# Patient Record
Sex: Female | Born: 1990 | Race: Black or African American | Hispanic: No | Marital: Single | Smoking: Never smoker
Health system: Southern US, Community
[De-identification: ages and names within clinical notes are randomized; demographics above are authoritative.]

## PROBLEM LIST (undated history)

## (undated) DIAGNOSIS — J45909 Unspecified asthma, uncomplicated: Secondary | ICD-10-CM

## (undated) DIAGNOSIS — O24419 Gestational diabetes mellitus in pregnancy, unspecified control: Secondary | ICD-10-CM

## (undated) DIAGNOSIS — J189 Pneumonia, unspecified organism: Secondary | ICD-10-CM

## (undated) HISTORY — DX: Pneumonia, unspecified organism: J18.9

## (undated) HISTORY — DX: Unspecified asthma, uncomplicated: J45.909

## (undated) HISTORY — PX: NO PAST SURGERIES: SHX2092

---
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
## 2009-10-09 ENCOUNTER — Emergency Department (HOSPITAL_COMMUNITY): Admission: EM | Admit: 2009-10-09 | Discharge: 2009-10-09 | Payer: Self-pay | Admitting: Emergency Medicine

## 2010-03-13 ENCOUNTER — Emergency Department (HOSPITAL_COMMUNITY)
Admission: EM | Admit: 2010-03-13 | Discharge: 2010-03-13 | Disposition: A | Discharge status: Home / Self Care | Payer: Self-pay | Attending: Emergency Medicine | Admitting: Emergency Medicine

## 2010-03-13 DIAGNOSIS — M25519 Pain in unspecified shoulder: Secondary | ICD-10-CM | POA: Insufficient documentation

## 2010-03-13 DIAGNOSIS — R209 Unspecified disturbances of skin sensation: Secondary | ICD-10-CM | POA: Insufficient documentation

## 2010-03-13 DIAGNOSIS — R21 Rash and other nonspecific skin eruption: Secondary | ICD-10-CM | POA: Insufficient documentation

## 2010-03-13 DIAGNOSIS — R29898 Other symptoms and signs involving the musculoskeletal system: Secondary | ICD-10-CM | POA: Insufficient documentation

## 2010-03-13 DIAGNOSIS — J45909 Unspecified asthma, uncomplicated: Secondary | ICD-10-CM | POA: Insufficient documentation

## 2010-03-13 DIAGNOSIS — R6883 Chills (without fever): Secondary | ICD-10-CM | POA: Insufficient documentation

## 2010-03-13 DIAGNOSIS — R51 Headache: Secondary | ICD-10-CM | POA: Insufficient documentation

## 2010-03-13 DIAGNOSIS — M79609 Pain in unspecified limb: Secondary | ICD-10-CM | POA: Insufficient documentation

## 2010-03-13 LAB — URINALYSIS, ROUTINE W REFLEX MICROSCOPIC
Bilirubin Urine: NEGATIVE
Glucose, UA: NEGATIVE mg/dL
Ketones, ur: NEGATIVE mg/dL
Nitrite: NEGATIVE
Protein, ur: NEGATIVE mg/dL
Specific Gravity, Urine: 1.012 (ref 1.005–1.030)
Urobilinogen, UA: 0.2 mg/dL (ref 0.0–1.0)
pH: 5.5 (ref 5.0–8.0)

## 2010-03-13 LAB — URINE MICROSCOPIC-ADD ON

## 2010-03-13 LAB — POCT I-STAT, CHEM 8
BUN: 8 mg/dL (ref 6–23)
Chloride: 102 mEq/L (ref 96–112)
Creatinine, Ser: 0.9 mg/dL (ref 0.4–1.2)
Sodium: 138 mEq/L (ref 135–145)
TCO2: 26 mmol/L (ref 0–100)

## 2010-03-13 LAB — POCT PREGNANCY, URINE: Preg Test, Ur: NEGATIVE

## 2010-05-03 ENCOUNTER — Inpatient Hospital Stay (INDEPENDENT_AMBULATORY_CARE_PROVIDER_SITE_OTHER)
Admission: RE | Admit: 2010-05-03 | Discharge: 2010-05-03 | Disposition: A | Discharge status: Home / Self Care | Payer: Self-pay | Source: Ambulatory Visit | Attending: Family Medicine | Admitting: Family Medicine

## 2010-05-03 DIAGNOSIS — N390 Urinary tract infection, site not specified: Secondary | ICD-10-CM

## 2010-05-03 DIAGNOSIS — N898 Other specified noninflammatory disorders of vagina: Secondary | ICD-10-CM

## 2010-05-03 LAB — POCT URINALYSIS DIP (DEVICE)
Bilirubin Urine: NEGATIVE
Glucose, UA: NEGATIVE mg/dL
Ketones, ur: NEGATIVE mg/dL
Specific Gravity, Urine: 1.015 (ref 1.005–1.030)
pH: 7 (ref 5.0–8.0)

## 2010-05-11 ENCOUNTER — Emergency Department (HOSPITAL_COMMUNITY)
Admission: EM | Admit: 2010-05-11 | Discharge: 2010-05-11 | Disposition: A | Discharge status: Home / Self Care | Payer: Self-pay | Attending: Emergency Medicine | Admitting: Emergency Medicine

## 2010-05-11 DIAGNOSIS — L298 Other pruritus: Secondary | ICD-10-CM | POA: Insufficient documentation

## 2010-05-11 DIAGNOSIS — J3489 Other specified disorders of nose and nasal sinuses: Secondary | ICD-10-CM | POA: Insufficient documentation

## 2010-05-11 DIAGNOSIS — L2989 Other pruritus: Secondary | ICD-10-CM | POA: Insufficient documentation

## 2010-05-11 DIAGNOSIS — R070 Pain in throat: Secondary | ICD-10-CM | POA: Insufficient documentation

## 2010-05-11 DIAGNOSIS — R071 Chest pain on breathing: Secondary | ICD-10-CM | POA: Insufficient documentation

## 2010-05-11 DIAGNOSIS — J45909 Unspecified asthma, uncomplicated: Secondary | ICD-10-CM | POA: Insufficient documentation

## 2010-05-11 DIAGNOSIS — L259 Unspecified contact dermatitis, unspecified cause: Secondary | ICD-10-CM | POA: Insufficient documentation

## 2010-05-11 DIAGNOSIS — J069 Acute upper respiratory infection, unspecified: Secondary | ICD-10-CM | POA: Insufficient documentation

## 2010-06-21 ENCOUNTER — Emergency Department (HOSPITAL_COMMUNITY)
Admission: EM | Admit: 2010-06-21 | Discharge: 2010-06-21 | Disposition: A | Discharge status: Home / Self Care | Payer: Self-pay | Attending: Emergency Medicine | Admitting: Emergency Medicine

## 2010-06-21 DIAGNOSIS — J3489 Other specified disorders of nose and nasal sinuses: Secondary | ICD-10-CM | POA: Insufficient documentation

## 2010-06-21 DIAGNOSIS — R059 Cough, unspecified: Secondary | ICD-10-CM | POA: Insufficient documentation

## 2010-06-21 DIAGNOSIS — R6883 Chills (without fever): Secondary | ICD-10-CM | POA: Insufficient documentation

## 2010-06-21 DIAGNOSIS — J069 Acute upper respiratory infection, unspecified: Secondary | ICD-10-CM | POA: Insufficient documentation

## 2010-06-21 DIAGNOSIS — R110 Nausea: Secondary | ICD-10-CM | POA: Insufficient documentation

## 2010-06-21 DIAGNOSIS — J45909 Unspecified asthma, uncomplicated: Secondary | ICD-10-CM | POA: Insufficient documentation

## 2010-06-21 DIAGNOSIS — J029 Acute pharyngitis, unspecified: Secondary | ICD-10-CM | POA: Insufficient documentation

## 2010-06-21 DIAGNOSIS — R05 Cough: Secondary | ICD-10-CM | POA: Insufficient documentation

## 2010-06-21 DIAGNOSIS — B9789 Other viral agents as the cause of diseases classified elsewhere: Secondary | ICD-10-CM | POA: Insufficient documentation

## 2010-06-21 DIAGNOSIS — R197 Diarrhea, unspecified: Secondary | ICD-10-CM | POA: Insufficient documentation

## 2010-10-15 LAB — POCT RAPID STREP A: Streptococcus, Group A Screen (Direct): NEGATIVE

## 2013-09-27 ENCOUNTER — Encounter (HOSPITAL_COMMUNITY): Payer: Self-pay | Admitting: Emergency Medicine

## 2013-09-27 ENCOUNTER — Emergency Department (HOSPITAL_COMMUNITY)
Admission: EM | Admit: 2013-09-27 | Discharge: 2013-09-27 | Disposition: A | Discharge status: Home / Self Care | Payer: Self-pay | Attending: Emergency Medicine | Admitting: Emergency Medicine

## 2013-09-27 DIAGNOSIS — N390 Urinary tract infection, site not specified: Secondary | ICD-10-CM | POA: Insufficient documentation

## 2013-09-27 DIAGNOSIS — Z3202 Encounter for pregnancy test, result negative: Secondary | ICD-10-CM | POA: Insufficient documentation

## 2013-09-27 DIAGNOSIS — A499 Bacterial infection, unspecified: Secondary | ICD-10-CM | POA: Insufficient documentation

## 2013-09-27 DIAGNOSIS — N760 Acute vaginitis: Secondary | ICD-10-CM | POA: Insufficient documentation

## 2013-09-27 DIAGNOSIS — R109 Unspecified abdominal pain: Secondary | ICD-10-CM | POA: Insufficient documentation

## 2013-09-27 DIAGNOSIS — B9689 Other specified bacterial agents as the cause of diseases classified elsewhere: Secondary | ICD-10-CM | POA: Insufficient documentation

## 2013-09-27 LAB — URINE MICROSCOPIC-ADD ON

## 2013-09-27 LAB — URINALYSIS, ROUTINE W REFLEX MICROSCOPIC
BILIRUBIN URINE: NEGATIVE
Glucose, UA: NEGATIVE mg/dL
Ketones, ur: NEGATIVE mg/dL
Nitrite: NEGATIVE
PROTEIN: NEGATIVE mg/dL
Specific Gravity, Urine: 1.011 (ref 1.005–1.030)
UROBILINOGEN UA: 0.2 mg/dL (ref 0.0–1.0)
pH: 6 (ref 5.0–8.0)

## 2013-09-27 LAB — POC URINE PREG, ED: PREG TEST UR: NEGATIVE

## 2013-09-27 LAB — WET PREP, GENITAL
Trich, Wet Prep: NONE SEEN
YEAST WET PREP: NONE SEEN

## 2013-09-27 LAB — RPR

## 2013-09-27 LAB — HIV ANTIBODY (ROUTINE TESTING W REFLEX): HIV 1&2 Ab, 4th Generation: NONREACTIVE

## 2013-09-27 MED ORDER — NAPROXEN 500 MG PO TABS: 500.0000 mg | ORAL_TABLET | Freq: Two times a day (BID) | ORAL | Status: DC

## 2013-09-27 MED ORDER — CEPHALEXIN 500 MG PO CAPS: 500.0000 mg | ORAL_CAPSULE | Freq: Four times a day (QID) | ORAL | Status: DC

## 2013-09-27 MED ORDER — METRONIDAZOLE 500 MG PO TABS: 500.0000 mg | ORAL_TABLET | Freq: Two times a day (BID) | ORAL | Status: DC

## 2013-09-27 NOTE — Discharge Instructions (Signed)
You have been seen today for your complaint lower abdominal pain. Your wet prep shows Bacterial vaginosis (read below). Your lab work showed urine infection. Your discharge medications include: 1) Keflex Please take all of your antibiotics until finished!   You may develop abdominal discomfort or diarrhea from the antibiotic.  You may help offset this with probiotics which you can buy or get in yogurt. Do not eat  or take the probiotics until 2 hours after your antibiotic.  2) Flagyl Do not drink alcohol with this medication. Home care instructions are as follows:  1) please drink plenty of water, avoid tea and beverages with caffeine like coffee or soda 2) if you are sexually active, ,make sure to urinate immediately after intercourse Follow up with: your doctor or the emergency department Please seek immediate medical care if you develop any of the following symptoms: SEEK MEDICAL CARE IF:  You have back pain.  You develop a fever.  Your symptoms do not begin to resolve within 3 days.  SEEK IMMEDIATE MEDICAL CARE IF:  You have severe back pain or lower abdominal pain.  You develop chills.  You have nausea or vomiting.  You have continued burning or discomfort with urination.  Bacterial Vaginosis Bacterial vaginosis is a vaginal infection that occurs when the normal balance of bacteria in the vagina is disrupted. It results from an overgrowth of certain bacteria. This is the most common vaginal infection in women of childbearing age. Treatment is important to prevent complications, especially in pregnant women, as it can cause a premature delivery. CAUSES  Bacterial vaginosis is caused by an increase in harmful bacteria that are normally present in smaller amounts in the vagina. Several different kinds of bacteria can cause bacterial vaginosis. However, the reason that the condition develops is not fully understood. RISK FACTORS Certain activities or behaviors can put you at an increased  risk of developing bacterial vaginosis, including:  Having a new sex partner or multiple sex partners.  Douching.  Using an intrauterine device (IUD) for contraception. Women do not get bacterial vaginosis from toilet seats, bedding, swimming pools, or contact with objects around them. SIGNS AND SYMPTOMS  Some women with bacterial vaginosis have no signs or symptoms. Common symptoms include:  Grey vaginal discharge.  A fishlike odor with discharge, especially after sexual intercourse.  Itching or burning of the vagina and vulva.  Burning or pain with urination. DIAGNOSIS  Your health care provider will take a medical history and examine the vagina for signs of bacterial vaginosis. A sample of vaginal fluid may be taken. Your health care provider will look at this sample under a microscope to check for bacteria and abnormal cells. A vaginal pH test may also be done.  TREATMENT  Bacterial vaginosis may be treated with antibiotic medicines. These may be given in the form of a pill or a vaginal cream. A second round of antibiotics may be prescribed if the condition comes back after treatment.  HOME CARE INSTRUCTIONS   Only take over-the-counter or prescription medicines as directed by your health care provider.  If antibiotic medicine was prescribed, take it as directed. Make sure you finish it even if you start to feel better.  Do not have sex until treatment is completed.  Tell all sexual partners that you have a vaginal infection. They should see their health care provider and be treated if they have problems, such as a mild rash or itching.  Practice safe sex by using condoms and only having  one sex partner. SEEK MEDICAL CARE IF:   Your symptoms are not improving after 3 days of treatment.  You have increased discharge or pain.  You have a fever. MAKE SURE YOU:   Understand these instructions.  Will watch your condition.  Will get help right away if you are not doing  well or get worse. FOR MORE INFORMATION  Centers for Disease Control and Prevention, Division of STD Prevention: SolutionApps.co.za American Sexual Health Association (ASHA): www.ashastd.org  Document Released: 12/23/2004 Document Revised: 10/13/2012 Document Reviewed: 08/04/2012 [facility] Patient Information 2015 [place], [place]. This information is not intended to replace advice given to you by your health care provider. Make sure you discuss any questions you have with your health care provider. Metronidazole extended-release tablets What is this medicine? METRONIDAZOLE (me troe NI da zole) is an antiinfective. It is used to treat certain kinds of bacterial and protozoal infections. It will not work for colds, flu, or other viral infections. This medicine may be used for other purposes; ask your health care provider or pharmacist if you have questions. COMMON BRAND NAME(S): Flagyl ER What should I tell my health care provider before I take this medicine? They need to know if you have any of these conditions: -anemia or other blood disorders -disease of the nervous system -fungal or yeast infection -if you drink alcohol containing drinks -liver disease -seizures -an unusual or allergic reaction to metronidazole, or other medicines, foods, dyes, or preservatives -pregnant or trying to get pregnant -breast-feeding How should I use this medicine? Take this medicine by mouth with a full glass of water. Follow the directions on the prescription label. Do not crush or chew. Take this medicine on an empty stomach 1 hour before or 2 hours after meals or food. Take your medicine at regular intervals. Do not take your medicine more often than directed. Take all of your medicine as directed even if you think you are better. Do not skip doses or stop your medicine early. Talk to your pediatrician regarding the use of this medicine in children. Special care may be needed. Overdosage: If you think you have  taken too much of this medicine contact a poison control center or emergency room at once. NOTE: This medicine is only for you. Do not share this medicine with others. What if I miss a dose? If you miss a dose, take it as soon as you can. If it is almost time for your next dose, take only that dose. Do not take double or extra doses. What may interact with this medicine? Do not take this medicine with any of the following medications: -alcohol or any product that contains alcohol -amprenavir oral solution -cisapride -disulfiram -dofetilide -dronedarone -paclitaxel injection -pimozide -ritonavir oral solution -sertraline oral solution -sulfamethoxazole-trimethoprim injection -thioridazine -ziprasidone This medicine may also interact with the following medications: -birth control pills -cimetidine -lithium -other medicines that prolong the QT interval (cause an abnormal heart rhythm) -phenobarbital -phenytoin -warfarin This list may not describe all possible interactions. Give your health care provider a list of all the medicines, herbs, non-prescription drugs, or dietary supplements you use. Also tell them if you smoke, drink alcohol, or use illegal drugs. Some items may interact with your medicine. What should I watch for while using this medicine? Tell your doctor or health care professional if your symptoms do not improve or if they get worse. You may get drowsy or dizzy. Do not drive, use machinery, or do anything that needs mental alertness until you know  how this medicine affects you. Do not stand or sit up quickly, especially if you are an older patient. This reduces the risk of dizzy or fainting spells. Avoid alcoholic drinks while you are taking this medicine and for three days afterward. Alcohol may make you feel dizzy, sick, or flushed. If you are being treated for a sexually transmitted disease, avoid sexual contact until you have finished your treatment. Your sexual  partner may also need treatment. What side effects may I notice from receiving this medicine? Side effects that you should report to your doctor or health care professional as soon as possible: -allergic reactions like skin rash, itching or hives, swelling of the face, lips, or tongue -confusion, clumsiness -difficulty speaking -discolored or sore mouth -dizziness -fever, infection -numbness, tingling, pain or weakness in the hands or feet -trouble passing urine or change in the amount of urine -redness, blistering, peeling or loosening of the skin, including inside the mouth -seizures -unusually weak or tired -vaginal irritation, dryness, or discharge Side effects that usually do not require medical attention (report to your doctor or health care professional if they continue or are bothersome): -diarrhea -headache -irritability -metallic taste -nausea -stomach pain or cramps -trouble sleeping This list may not describe all possible side effects. Call your doctor for medical advice about side effects. You may report side effects to FDA at 1-800-FDA-1088. Where should I keep my medicine? Keep out of the reach of children. Store at room temperature between 15 and 30 degrees C (59 and 86 degrees F). Protect from light and moisture. Keep container tightly closed. Throw away any unused medicine after the expiration date. NOTE: This sheet is a summary. It may not cover all possible information. If you have questions about this medicine, talk to your doctor, pharmacist, or health care provider.  2015, Elsevier/Gold Standard. (2012-07-30 14:05:10)  Emergency Department Resource Guide 1) Find a Doctor and Pay Out of Pocket Although you won't have to find out who is covered by your insurance plan, it is a good idea to ask around and get recommendations. You will then need to call the office and see if the doctor you have chosen will accept you as a new patient and what types of options they  offer for patients who are self-pay. Some doctors offer discounts or will set up payment plans for their patients who do not have insurance, but you will need to ask so you aren't surprised when you get to your appointment.  2) Contact Your Local Health Department Not all health departments have doctors that can see patients for sick visits, but many do, so it is worth a call to see if yours does. If you don't know where your local health department is, you can check in your phone book. The CDC also has a tool to help you locate your state's health department, and many state websites also have listings of all of their local health departments.  3) Find a Walk-in Clinic If your illness is not likely to be very severe or complicated, you may want to try a walk in clinic. These are popping up all over the country in pharmacies, drugstores, and shopping centers. They're usually staffed by nurse practitioners or physician assistants that have been trained to treat common illnesses and complaints. They're usually fairly quick and inexpensive. However, if you have serious medical issues or chronic medical problems, these are probably not your best option.  No Primary Care Doctor: - Call Health Connect at  [phone] -  they can help you locate a primary care doctor that  accepts your insurance, provides certain services, etc. - Physician Referral Service- [phone]  Chronic Pain Problems: Organization         Address  Phone   Notes  [facility]  [phone] Patients need to be referred by their primary care doctor.   Medication Assistance: Organization         Address  Phone   Notes  [facility] Medication [facility] [address] [place] [place], [place] [address] [phone] --Must be a resident of [facility] -- Must have NO insurance coverage whatsoever (no Medicaid/ Medicare, etc.) -- The pt. MUST have a primary care doctor that directs their care  regularly and follows them in the community   [facility]  [phone]   [employer]  [phone]    Agencies that provide inexpensive medical care: Organization         Address  Phone   Notes  [facility]  [phone]   [facility]    [phone]   [facility] [address] [place], [place] [address] [phone]   [facility] [address] [place] [place] [phone]   [facility]anned Parenthood    [phone]   [facility]    [phone]   [facility]  [address], [place] Phone:  [phone], Fax:  [phone] Hours of Operation:  9 am - 6 pm, M-F.  Also accepts Medicaid/Medicare and self-pay.  [facility]  [address], [place] Phone: [phone], Fax: [phone]. Hours of Operation:  8:30 am - 5:30 pm, M-F.  Also accepts Medicaid and self-pay.  [facility] [place] [address], [place] Phone: [phone]   [facility] [address] Natasha Bence [place], [place] [phone], Ext. [phone] Mondays & Thursdays: 7-9 AM.  First 15 patients are seen on a first come, first serve basis.    Medicaid-accepting [facility] Providers:  Organization         Address  Phone   Notes  [facility] [address], [place] [phone] Also accepts self-pay patients.  [facility] [address] Laurell Josephs [place], [place]  [phone]   [facility] [address], [place] [phone]   [facility] [address], [place] [phone]   Renaye Rakers [address], [place]   [phone] Only accepts [place] patients after they have their name applied to their card.   Self-Pay (no insurance) in [facility]:  Organization         Address  Phone    Notes  Sickle Cell Patients, [facility] Internal Medicine [address] [place], [place] [phone]   [facility] [address] [place], [place] [phone]   [facility] [place]  [address], [place] [phone]   [facility]/Dr. Osei-Bonsu  [address], [place] or [address], [place] [phone] Phone number for both [place] and [place] locations is the same.  [facility] [address], [place] [phone]   [facility] [address], [address] or [place]  [facility] [address] [phone]   [facility] [address] [place], [place] [phone], phone; [phone], fax Sees patients 1st and 3rd Saturday of every month.  Must not qualify for public or private insurance (i.e. Medicaid, Medicare, [place] Health Choice, Veterans' Benefits)  Household income should be no more than 200% of the poverty level The clinic cannot treat you if you are pregnant or think you are pregnant  Sexually transmitted diseases are not treated at the clinic.    Dental Care: Organization         Address  Phone  Notes  [facility] Department of [facility] [address] [place], [place] [phone] Accepts children up to age 10 who are enrolled in [place] or [place] Health Choice; pregnant women with a Medicaid card; and children who have applied for Medicaid or [place] Health Choice, but were declined, whose parents can pay a reduced fee at time of service.  [facility] of [facility]  [address], [place] [phone] Accepts children up to age 79 who are enrolled in [place] or [place] Health Choice; pregnant women with a Medicaid card; and children who have applied for Medicaid or [place] Health Choice, but were declined, whose parents can pay a reduced fee at time of service.  [facility]  [address] [place], [place] [phone] Patients are seen by appointment only. Walk-ins are not accepted. [facility] will see patients 6 years of age and older. Monday - Tuesday (8am-5pm) Most Wednesdays (8:30-5pm) $30 per visit, cash only  [facility] Adult [facility] Access [facility]  [address], [facility] [phone] Patients are seen by appointment only. Walk-ins are not accepted. [facility] will see patients 72 years of age and older. One Wednesday Evening (Monthly: Volunteer Based).  $30 per visit, cash only  [employer]  [phone] for adults; Children under age 70, call Graduate Pediatric Dentistry at [phone]. Children aged 16-14, please call [phone] to request a pediatric application.  Dental services are provided in all areas of dental care including fillings, crowns and bridges, complete and partial dentures, implants, gum treatment, root canals, and extractions. Preventive care is also provided. Treatment is provided to both adults and children. Patients are selected via a lottery and there is often a waiting list.   [facility] [address] [place]  [phone] [email or web address]   [facility] [address] [place], [place] [phone], Ext. [phone] Second and Fourth Thursday of each month, opens at 6:30 AM; Clinic ends at 9 AM.  Patients are seen on a first-come first-served basis, and a limited number are seen during each clinic.   [facility]  [address] Ether Griffins [place], [place] [phone]   Eligibility Requirements You must have lived in [place], [place], or [place] counties for at least the last three months.   You cannot be eligible for state or federal sponsored [place], including [employer], [place], or [employer].   You generally cannot be eligible for healthcare insurance through your employer.    How to  apply: Eligibility screenings are held every Tuesday and Wednesday afternoon from 1:00 pm until 4:00 pm. You do not need an appointment for the interview!  [facility] [address], [place], [place] [phone]   [facility] Health Department  [phone]   [facility]  Health Department  [phone]   [facility] Health Department  [phone]    Behavioral Health Resources in the Community: Intensive Outpatient Programs Organization         Address  Phone  Notes  [facility] [address] [place], [place] [phone]   [facility] [address] [place], [place] [phone]   [facility] [address] [place], [place]  [phone]   [facility] Mental Health [address]  [place], [place] [phone] or [phone]   Substance Abuse Resources Organization         Address  Phone  Notes  [facility]  [phone]   [facility]  [phone]   [facility]  [phone]   Floydene Flock  [phone]   Residential & Outpatient Substance Abuse Program  [phone]   Psychological Services Organization         Address  Phone  Notes  [facility] Behavioral Health  [phone]   [facility]  [phone]   [facility] Mental Health [address] [place] [phone] or [phone]    Mobile Crisis Teams Organization         Address  Phone  Notes  [facility], Mobile Crisis Care Unit  [phone]   [facility]  [address] [place], [place] [phone]   Doristine Locks [address] [place] [phone]    Self-Help/Support Groups Organization         Address  Phone             Notes  Mental Health Assoc. of  - variety of support groups  336- I7437963 Call for more information  Narcotics Anonymous (NA), [facility] [address], [employer] [place]  2 meetings at this location   Statistician         Address  Phone  Notes  [facility] [address],    [place]  [phone]   [facility]  [address] [place] [address], [place], [place] [phone]   [facility] [address] [place], [place] [phone] Admissions: 8am-3pm M-F  [facility] [address]    [place], [place] [phone]   [facility] [address] [place], [place], [place] [phone]   [facility] [address],  [place], [place] [phone]   [facility] - Intensive Outpatient [address] Laurell Josephs [address], [place], [place] [phone]   [facility] ([facility] [address] [place]  [place], [place] [phone] or [phone]   [facility] ([facility]) [address], [place], [place] [phone] Accepts Medicaid  [facility] [address],  [place] [phone] Substance Abuse/Addiction Treatment   [facility] Organization         Address  Phone  Notes  [facility]  [phone]   Angie Fava, PhD [address] Ervin Knack [place], [place]   [phone] or [phone]   [facility] Behavioral   [address] [place], [place] [phone]   [facility] [address] [place], [place] [phone] Insurance/Medicaid/sponsorship through [employer] and Families [address]  [place], [place] [phone] Therapy/tele-psych/case  [facility] [address]   [place], [place] [phone]    Dr. Lolly Mustache  [phone]   [facility]  [employer] [facility] Dept. 1) [address] [place] 2) [address] [place] 3)  [address], [place] [phone]  [phone]   [facility] Child Abuse Hotline  510-039-0580 or [phone] (After Hours)

## 2013-09-27 NOTE — ED Provider Notes (Signed)
CSN: [id number]     Arrival date & time 09/27/13  5956 History   First MD Initiated Contact with Patient 09/27/13 0919     Chief Complaint  Patient presents with  . Urinary Tract Infection     (Consider location/radiation/quality/duration/timing/severity/associated sxs/prior Treatment) HPI Anna Stout is a 23 y.o. female who presents today with lower central abdominal pain and bilateral back that started yesterday. Pain is located over bladder and radiates to right and left side of abdomen. Thought she felt a slight fever yesterday. Denies N/V, frequency, urgency, gross hematuria, dysuria or vaginal discharge. Notes she has never had a UTI before.  History reviewed. No pertinent past medical history. History reviewed. No pertinent past surgical history. History reviewed. No pertinent family history. History  Substance Use Topics  . Smoking status: Never Smoker   . Smokeless tobacco: Not on file  . Alcohol Use: Not on file   OB History   Grav Para Term Preterm Abortions TAB SAB Ect Mult Living                 Review of Systems  Ten systems reviewed and are negative for acute change, except as noted in the HPI.    Allergies  Review of patient's allergies indicates no known allergies.  Home Medications   Prior to Admission medications   Not on File   BP 111/67  Pulse 66  Temp(Src) 97.8 F (36.6 C) (Oral)  Resp 18  Ht  (1.803 m)  Wt 220 lb (99.791 kg)  BMI 30.70 kg/m2  SpO2 97%  LMP 09/14/2013 Physical Exam  Nursing note and vitals reviewed. Constitutional: She is oriented to person, place, and time. She appears well-developed and well-nourished. No distress.  HENT:  Head: Normocephalic and atraumatic.  Eyes: Conjunctivae are normal. No scleral icterus.  Neck: Normal range of motion.  Cardiovascular: Normal rate, regular rhythm and normal heart sounds.  Exam reveals no gallop and no friction rub.   No murmur heard. Pulmonary/Chest:  Effort normal and breath sounds normal. No respiratory distress.  Abdominal: Soft. Bowel sounds are normal. She exhibits no distension and no mass. There is tenderness (Suprapubic, no CVA). There is no guarding.  Genitourinary:  Pelvic exam: normal external genitalia, vulva, vagina, cervix, uterus and adnexa. Mild diffuse tenderness on exam. Thick white vaginal discharge and foul odor.  Neurological: She is alert and oriented to person, place, and time.  Skin: Skin is warm and dry. She is not diaphoretic.    ED Course  Procedures (including critical care time) Labs Review Labs Reviewed - No data to display  Imaging Review No results found.   EKG Interpretation None      MDM   Final diagnoses:  BV (bacterial vaginosis)  UTI (lower urinary tract infection)    3:45 PM Patient sxs for uti. Will treat. Urine culture pending although UA is equicoval. Her wet prep is positive for BV,.    Arthor Captain, PA-C 09/27/13 1546

## 2013-09-27 NOTE — ED Notes (Signed)
Pt c/o of lower abd pain, lower back pain, and pain in abd when urinating. Pt denies any burning, increase in frequency and blood in urine.

## 2013-09-28 LAB — GC/CHLAMYDIA PROBE AMP
CT Probe RNA: NEGATIVE
GC Probe RNA: NEGATIVE

## 2013-09-29 LAB — URINE CULTURE

## 2013-09-30 NOTE — ED Provider Notes (Signed)
Medical screening examination/treatment/procedure(s) were performed by non-physician practitioner and as supervising physician I was immediately available for consultation/collaboration.   EKG Interpretation None       Arby Barrette, MD 09/30/13 [phone]

## 2013-10-07 NOTE — Discharge Planning (Signed)
[facility] & Eligibility Specialist   Spoke to patient regarding primary resources and establishing care with a provider. Orange card application and instructions provided. Resource guide and my contact information also given for any future questions or concerns. No other [facility] identified.

## 2014-03-20 ENCOUNTER — Emergency Department (INDEPENDENT_AMBULATORY_CARE_PROVIDER_SITE_OTHER)
Admission: EM | Admit: 2014-03-20 | Discharge: 2014-03-20 | Disposition: A | Discharge status: Home / Self Care | Payer: Self-pay | Source: Home / Self Care | Attending: Family Medicine | Admitting: Family Medicine

## 2014-03-20 ENCOUNTER — Encounter (HOSPITAL_COMMUNITY): Payer: Self-pay | Admitting: Emergency Medicine

## 2014-03-20 DIAGNOSIS — N644 Mastodynia: Secondary | ICD-10-CM

## 2014-03-20 DIAGNOSIS — Z3490 Encounter for supervision of normal pregnancy, unspecified, unspecified trimester: Secondary | ICD-10-CM

## 2014-03-20 DIAGNOSIS — Z3201 Encounter for pregnancy test, result positive: Secondary | ICD-10-CM

## 2014-03-20 LAB — POCT PREGNANCY, URINE
PREG TEST UR: POSITIVE — AB
Preg Test, Ur: POSITIVE — AB

## 2014-03-20 NOTE — Discharge Instructions (Signed)
Thank you for coming in today. Please start prenatal vitamins.  Follow up with OBGYN Take the letter to the health department to get pregnancy Medicaid.   [facility] [address] [place], [place] [phone]  [facility] [address] [place], [place] [phone]  [facility]: Silverio Layivard Sandra MD [address] [place], [place] [phone]  [facility] [address] [place], [place] [phone]) [phone]  [facility]: Marcelle OverlieGrewal Michelle MD [address] [place], [place] [phone]  [facility]: Tracey HarriesHenley Thomas F MD [address] [place], [place] [phone]) [phone]  [facility] [address] [place], [place] [phone]   [facility]: [address], [place], [place] [address] [phone]) [phone]

## 2014-03-20 NOTE — ED Notes (Signed)
Pt has had intermittent nausea, her nipples are tender and she is 2-3 weeks late for her next menstrual cycle.

## 2014-03-20 NOTE — ED Provider Notes (Signed)
Anna Stout is a 24 y.o. female who presents to Urgent Care today for amenorrhea, sore nipples, nausea. Symptoms present starting the last few days. Patient's last menstrual period was January 28. She is concerned she may be pregnant. She has not tried any medications yet for symptoms. She took a prenatal vitamin yesterday.   History reviewed. No pertinent past medical history. History reviewed. No pertinent past surgical history. History  Substance Use Topics  . Smoking status: Never Smoker   . Smokeless tobacco: Not on file  . Alcohol Use: Not on file   ROS as above Medications: No current facility-administered medications for this encounter.   Current Outpatient Prescriptions  Medication Sig Dispense Refill  . ibuprofen (ADVIL,MOTRIN) 200 MG tablet Take 200 mg by mouth every 6 (six) hours as needed for headache.    . naproxen (NAPROSYN) 500 MG tablet Take 1 tablet (500 mg total) by mouth 2 (two) times daily with a meal. 30 tablet 0   No Known Allergies   Exam:  BP 126/71 mmHg  Pulse 67  Temp(Src) 98.1 F (36.7 C) (Oral)  Resp 18  SpO2 99%  LMP 02/03/2014 (Exact Date) Gen: Well NAD HEENT: EOMI,  MMM Lungs: Normal work of breathing. CTABL Heart: RRR no MRG Abd: NABS, Soft. Nondistended, Nontender Exts: Brisk capillary refill, warm and well perfused.    Results for orders placed or performed during the hospital encounter of 03/20/14 (from the past 24 hour(s))  Pregnancy, urine POC     Status: Abnormal   Collection Time: 03/20/14  5:20 PM  Result Value Ref Range   Preg Test, Ur POSITIVE (A) NEGATIVE  Pregnancy, urine POC     Status: Abnormal   Collection Time: 03/20/14  5:25 PM  Result Value Ref Range   Preg Test, Ur POSITIVE (A) NEGATIVE   No results found.  Assessment and Plan: 24 y.o. female with pregnancy. Patient is about [redacted] weeks pregnant. Start prenatal vitamins. Follow-up with OB/GYN.  Discussed warning signs or symptoms. Please see  discharge instructions. Patient expresses understanding.     Rodolph BongEvan S Paislynn Hegstrom, MD 03/20/14 [phone]

## 2014-11-29 LAB — OB RESULTS CONSOLE GC/CHLAMYDIA
Chlamydia: NEGATIVE
GC PROBE AMP, GENITAL: NEGATIVE

## 2014-11-29 LAB — OB RESULTS CONSOLE VARICELLA ZOSTER ANTIBODY, IGG: VARICELLA IGG: IMMUNE

## 2014-11-29 LAB — OB RESULTS CONSOLE ABO/RH: RH Type: POSITIVE

## 2014-11-29 LAB — OB RESULTS CONSOLE HGB/HCT, BLOOD
HCT: 34 %
Hemoglobin: 11.5 g/dL

## 2014-11-29 LAB — OB RESULTS CONSOLE ANTIBODY SCREEN: Antibody Screen: NEGATIVE

## 2014-11-29 LAB — OB RESULTS CONSOLE RUBELLA ANTIBODY, IGM: RUBELLA: IMMUNE

## 2014-11-29 LAB — OB RESULTS CONSOLE PLATELET COUNT: PLATELETS: 158 10*3/uL

## 2014-11-29 LAB — OB RESULTS CONSOLE RPR: RPR: NONREACTIVE

## 2014-11-29 LAB — OB RESULTS CONSOLE HEPATITIS B SURFACE ANTIGEN: Hepatitis B Surface Ag: NEGATIVE

## 2014-12-06 ENCOUNTER — Other Ambulatory Visit (HOSPITAL_COMMUNITY): Payer: Self-pay | Admitting: Urology

## 2014-12-06 DIAGNOSIS — Z3689 Encounter for other specified antenatal screening: Secondary | ICD-10-CM

## 2014-12-18 ENCOUNTER — Ambulatory Visit (HOSPITAL_COMMUNITY): Payer: Medicaid Other | Attending: Physician Assistant

## 2014-12-18 ENCOUNTER — Ambulatory Visit: Payer: Medicaid Other | Admitting: *Deleted

## 2014-12-18 ENCOUNTER — Encounter: Payer: Medicaid Other | Attending: Obstetrics & Gynecology | Admitting: *Deleted

## 2014-12-18 ENCOUNTER — Encounter: Payer: Self-pay | Admitting: *Deleted

## 2014-12-18 VITALS — Ht 70.5 in | Wt 251.0 lb

## 2014-12-18 DIAGNOSIS — Z713 Dietary counseling and surveillance: Secondary | ICD-10-CM | POA: Diagnosis not present

## 2014-12-18 DIAGNOSIS — O24410 Gestational diabetes mellitus in pregnancy, diet controlled: Secondary | ICD-10-CM

## 2014-12-18 DIAGNOSIS — O24419 Gestational diabetes mellitus in pregnancy, unspecified control: Secondary | ICD-10-CM | POA: Insufficient documentation

## 2014-12-18 DIAGNOSIS — O0990 Supervision of high risk pregnancy, unspecified, unspecified trimester: Secondary | ICD-10-CM | POA: Insufficient documentation

## 2014-12-18 NOTE — Progress Notes (Signed)
  Patient was seen on 12/18/14 for Gestational Diabetes self-management . The following learning objectives were met by the patient :   States the definition of Gestational Diabetes  States why dietary management is important in controlling blood glucose  Describes the effects of carbohydrates on blood glucose levels  Demonstrates ability to create a balanced meal plan  Demonstrates carbohydrate counting   States when to check blood glucose levels  Demonstrates proper blood glucose monitoring techniques  States the effect of stress and exercise on blood glucose levels  States the importance of limiting caffeine and abstaining from alcohol and smoking  Plan:  Aim for 2 Carb Choices per meal (30 grams) +/- 1 either way for breakfast Aim for 3 Carb Choices per meal (45 grams) +/- 1 either way from lunch and dinner Aim for 1-2 Carbs per snack Begin reading food labels for Total Carbohydrate and sugar grams of foods Consider  increasing your activity level by walking daily as tolerated Begin checking BG before breakfast and 2 hours after first bit of breakfast, lunch and dinner after  as directed by MD  Take medication  as directed by MD  Blood glucose monitor given: TrueTest Blood glucose reading: 2hpp 75m/dl  Patient instructed to monitor glucose levels: FBS: 60 - <90 2 hour: <120  Patient received the following handouts:  Nutrition Diabetes and Pregnancy  Carbohydrate Counting List  Meal Planning worksheet  Patient will be seen for follow-up as needed.

## 2014-12-18 NOTE — Progress Notes (Signed)
Nutrition note: GDM diet education Pt has h/o obesity. Pt was recently diagnosed with GDM. Pt has gained 11# @ 19w, which is > expected. Pt reports eating 2 meals & 2 snacks/d. Pt is taking a PNV. Pt reports no N&V or heartburn. 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  Pt reports walking 2x/wk for 10-15 mins. Pt received verbal & written education on GDM diet. Encouraged ~30 mins of walking/d. Discussed wt gain goals of 11-20# or 0.5#/wk. Pt agrees to follow GDM diet with 3 meals & 1-3 snacks/d with proper CHO/ protein combination. Pt has WIC & plans to BF. F/u in 2-4 wks Blondell RevealLaura Keaghan Bowens, MS, RD, LDN, [facility]

## 2014-12-25 ENCOUNTER — Ambulatory Visit (INDEPENDENT_AMBULATORY_CARE_PROVIDER_SITE_OTHER): Payer: Medicaid Other | Admitting: Obstetrics & Gynecology

## 2014-12-25 ENCOUNTER — Encounter: Payer: Self-pay | Admitting: *Deleted

## 2014-12-25 ENCOUNTER — Encounter: Payer: Self-pay | Admitting: Obstetrics & Gynecology

## 2014-12-25 VITALS — BP 118/65 | HR 89 | Temp 97.8°F | Wt 250.0 lb

## 2014-12-25 DIAGNOSIS — R896 Abnormal cytological findings in specimens from other organs, systems and tissues: Secondary | ICD-10-CM

## 2014-12-25 DIAGNOSIS — IMO0002 Reserved for concepts with insufficient information to code with codable children: Secondary | ICD-10-CM | POA: Insufficient documentation

## 2014-12-25 LAB — POCT URINALYSIS DIP (DEVICE)
Bilirubin Urine: NEGATIVE
GLUCOSE, UA: NEGATIVE mg/dL
Ketones, ur: 40 mg/dL — AB
LEUKOCYTES UA: NEGATIVE
NITRITE: NEGATIVE
PROTEIN: NEGATIVE mg/dL
Specific Gravity, Urine: 1.01 (ref 1.005–1.030)
UROBILINOGEN UA: 0.2 mg/dL (ref 0.0–1.0)
pH: 5.5 (ref 5.0–8.0)

## 2014-12-25 MED ORDER — PRENATAL 27-0.8 MG PO TABS: 1.0000 | ORAL_TABLET | Freq: Every day | ORAL | Status: AC

## 2014-12-25 NOTE — Progress Notes (Signed)
Subjective:  Anna Stout is a 24 y.o. G2P0010 at 3279w5d being seen today for first visit to our office for prenatal care (transfer from HD for GDM).  She is currently monitored for the following issues for this high-risk pregnancy and has Supervision of high-risk pregnancy and Gestational diabetes on her problem list.  Patient reports no complaints.  Contractions: Not present. Vag. Bleeding: None.  Movement: Present. Denies leaking of fluid.   The following portions of the patient's history were reviewed and updated as appropriate: allergies, current medications, past family history, past medical history, past social history, past surgical history and problem list. Problem list updated.  Objective:   Filed Vitals:   12/25/14 0856  BP: 118/65  Pulse: 89  Temp: 97.8 F (36.6 C)  Weight: 250 lb (113.399 kg)    Fetal Status: Fetal Heart Rate (bpm): 152   Movement: Present     General:  Alert, oriented and cooperative. Patient is in no acute distress.  Skin: Skin is warm and dry. No rash noted.   Cardiovascular: Normal heart rate noted  Respiratory: Normal respiratory effort, no problems with respiration noted  Abdomen: Soft, gravid, appropriate for gestational age. Pain/Pressure: Absent     Pelvic: Vag. Bleeding: None     Cervical exam deferred        Extremities: Normal range of motion.  Edema: None  Mental Status: Normal mood and affect. Normal behavior. Normal judgment and thought content.   Urinalysis: Urine Protein: Negative Urine Glucose: Negative  Assessment and Plan:  Pregnancy: G2P0010 at 3579w5d  1.  GDM A1--first week of CBGs shows good control with diet.  Will continue managing with good diet control  4x day testing.  2.  Supervision HR pregnancy Anatomy US ordered  Need labs from HD to be put in problem list (Rn aware)  3.  ASCUS with HPV--needs colpo  Preterm labor symptoms and general obstetric precautions including but not limited to vaginal  bleeding, contractions, leaking of fluid and fetal movement were reviewed in detail with the patient. Please refer to After Visit Summary for other counseling recommendations.  Return in about 2 weeks (around 01/08/2015).   Lesly DukesKelly H Chele Cornell, MD

## 2014-12-25 NOTE — Progress Notes (Signed)
Anatomy U/S 12/27/14 @ 1115a with MFM. Pap results obtained, pt informed of abnormal results and need for colpo.  Pt verbalized understanding.

## 2014-12-27 ENCOUNTER — Ambulatory Visit (HOSPITAL_COMMUNITY)
Admission: RE | Admit: 2014-12-27 | Discharge: 2014-12-27 | Disposition: A | Discharge status: Home / Self Care | Payer: Medicaid Other | Source: Ambulatory Visit | Attending: Urology | Admitting: Urology

## 2014-12-27 ENCOUNTER — Encounter: Payer: Self-pay | Admitting: *Deleted

## 2014-12-27 VITALS — BP 116/41 | HR 73 | Wt 247.8 lb

## 2014-12-27 DIAGNOSIS — Z3A20 20 weeks gestation of pregnancy: Secondary | ICD-10-CM | POA: Insufficient documentation

## 2014-12-27 DIAGNOSIS — Z36 Encounter for antenatal screening of mother: Secondary | ICD-10-CM | POA: Diagnosis not present

## 2014-12-27 DIAGNOSIS — Z3689 Encounter for other specified antenatal screening: Secondary | ICD-10-CM

## 2014-12-27 DIAGNOSIS — O24410 Gestational diabetes mellitus in pregnancy, diet controlled: Secondary | ICD-10-CM

## 2014-12-28 ENCOUNTER — Encounter: Payer: Medicaid Other | Admitting: Obstetrics and Gynecology

## 2015-01-07 NOTE — L&D Delivery Note (Signed)
Delivery Note Labor was augmented with Cytotec, Foley, AROM, and Pitocin. At 5:18 PM a viable female was delivered via  (Presentation: vertex; occiput anterior ).  APGAR: 9, 9; weight pending.   Placenta status: spontaneous, intact.  Cord: 3 vessel with the following complications: none.  Cord pH: not obtained  Anesthesia: Epidural  Episiotomy:  none Lacerations:  none Suture Repair: n/a Est. Blood Loss (mL):  150ml  Mom to postpartum.  Baby to Couplet care / Skin to Skin.  Jinny BlossomKaty D Mayo 05/10/2015, 5:33 PM   OB fellow attestation: Patient is a [id number] at 4128w1d who was admitted for IOL for A1GDM.  She progressed with augmentation via  FB, cytoptec, pitocin, AROM.  I was gloved and present for delivery in its entirety.  Second stage of labor progressed, baby delivered after 1 contractions.  No decels during second stage noted.  Complications: none  Lacerations: None  EBL: 150mL  Federico FlakeKimberly Niles Advay Volante, MD 6:09 PM

## 2015-01-15 ENCOUNTER — Encounter: Payer: Medicaid Other | Admitting: Obstetrics & Gynecology

## 2015-01-29 ENCOUNTER — Encounter: Payer: Self-pay | Admitting: Family Medicine

## 2015-01-29 ENCOUNTER — Other Ambulatory Visit (HOSPITAL_COMMUNITY)
Admission: RE | Admit: 2015-01-29 | Discharge: 2015-01-29 | Disposition: A | Discharge status: Home / Self Care | Payer: Medicaid Other | Source: Ambulatory Visit | Attending: Obstetrics & Gynecology | Admitting: Obstetrics & Gynecology

## 2015-01-29 ENCOUNTER — Ambulatory Visit (INDEPENDENT_AMBULATORY_CARE_PROVIDER_SITE_OTHER): Payer: Medicaid Other | Admitting: Family Medicine

## 2015-01-29 VITALS — BP 128/63 | HR 75 | Temp 97.9°F | Wt 252.0 lb

## 2015-01-29 DIAGNOSIS — N870 Mild cervical dysplasia: Secondary | ICD-10-CM | POA: Diagnosis not present

## 2015-01-29 DIAGNOSIS — IMO0002 Reserved for concepts with insufficient information to code with codable children: Secondary | ICD-10-CM

## 2015-01-29 DIAGNOSIS — R87610 Atypical squamous cells of undetermined significance on cytologic smear of cervix (ASC-US): Secondary | ICD-10-CM | POA: Diagnosis present

## 2015-01-29 DIAGNOSIS — R896 Abnormal cytological findings in specimens from other organs, systems and tissues: Secondary | ICD-10-CM

## 2015-01-29 DIAGNOSIS — R87810 Cervical high risk human papillomavirus (HPV) DNA test positive: Secondary | ICD-10-CM | POA: Insufficient documentation

## 2015-01-29 DIAGNOSIS — O24410 Gestational diabetes mellitus in pregnancy, diet controlled: Secondary | ICD-10-CM | POA: Diagnosis present

## 2015-01-29 DIAGNOSIS — O0992 Supervision of high risk pregnancy, unspecified, second trimester: Secondary | ICD-10-CM

## 2015-01-29 LAB — POCT URINALYSIS DIP (DEVICE)
Bilirubin Urine: NEGATIVE
Glucose, UA: NEGATIVE mg/dL
Ketones, ur: NEGATIVE mg/dL
Nitrite: NEGATIVE
PROTEIN: NEGATIVE mg/dL
SPECIFIC GRAVITY, URINE: 1.015 (ref 1.005–1.030)
UROBILINOGEN UA: 0.2 mg/dL (ref 0.0–1.0)
pH: 5.5 (ref 5.0–8.0)

## 2015-01-29 NOTE — Patient Instructions (Addendum)
Following an appropriate diet and keeping your blood sugar under control is the most important thing to do for your health and that of your unborn baby.  Please check your blood sugar 4 times daily.  Please keep accurate BS logs and bring them with you to every visit.  Please bring your meter also.  Goals for Blood sugar should be: 1. Fasting (first thing in the morning before eating) should be less than 90.   2.  2 hours after meals should be less than 120.  Please eat 3 meals and 3 snacks.  Include protein (meat, dairy-cheese, eggs, nuts) with all meals.  Be mindful that carbohydrates increase your blood sugar.  Not just sweet food (cookies, cake, donuts, fruit, juice, soda) but also bread, pasta, rice, and potatoes.  You have to limit how many carbs you are eating.  Adding exercise, as little as 30 minutes a day can decrease your blood sugar.  Second Trimester of Pregnancy The second trimester is from week 13 through week 28, months 4 through 6. The second trimester is often a time when you feel your best. Your body has also adjusted to being pregnant, and you begin to feel better physically. Usually, morning sickness has lessened or quit completely, you may have more energy, and you may have an increase in appetite. The second trimester is also a time when the fetus is growing rapidly. At the end of the sixth month, the fetus is about 9 inches long and weighs about 1 pounds. You will likely begin to feel the baby move (quickening) between 18 and 20 weeks of the pregnancy. BODY CHANGES Your body goes through many changes during pregnancy. The changes vary from woman to woman.   Your weight will continue to increase. You will notice your lower abdomen bulging out.  You may begin to get stretch marks on your hips, abdomen, and breasts.  You may develop headaches that can be relieved by medicines approved by your health care provider.  You may urinate more often because the fetus is  pressing on your bladder.  You may develop or continue to have heartburn as a result of your pregnancy.  You may develop constipation because certain hormones are causing the muscles that push waste through your intestines to slow down.  You may develop hemorrhoids or swollen, bulging veins (varicose veins).  You may have back pain because of the weight gain and pregnancy hormones relaxing your joints between the bones in your pelvis and as a result of a shift in weight and the muscles that support your balance.  Your breasts will continue to grow and be tender.  Your gums may bleed and may be sensitive to brushing and flossing.  Dark spots or blotches (chloasma, mask of pregnancy) may develop on your face. This will likely fade after the baby is born.  A dark line from your belly button to the pubic area (linea nigra) may appear. This will likely fade after the baby is born.  You may have changes in your hair. These can include thickening of your hair, rapid growth, and changes in texture. Some women also have hair loss during or after pregnancy, or hair that feels dry or thin. Your hair will most likely return to normal after your baby is born. WHAT TO EXPECT AT YOUR PRENATAL VISITS During a routine prenatal visit:  You will be weighed to make sure you and the fetus are growing normally.  Your blood pressure will be taken.    Your abdomen will be measured to track your baby's growth.  The fetal heartbeat will be listened to.  Any test results from the previous visit will be discussed. Your health care provider may ask you:  How you are feeling.  If you are feeling the baby move.  If you have had any abnormal symptoms, such as leaking fluid, bleeding, severe headaches, or abdominal cramping.  If you are using any tobacco products, including cigarettes, chewing tobacco, and electronic cigarettes.  If you have any questions. Other tests that may be performed during your second  trimester include:  Blood tests that check for:  Low iron levels (anemia).  Gestational diabetes (between 24 and 28 weeks).  Rh antibodies.  Urine tests to check for infections, diabetes, or protein in the urine.  An ultrasound to confirm the proper growth and development of the baby.  An amniocentesis to check for possible genetic problems.  Fetal screens for spina bifida and Down syndrome.  HIV (human immunodeficiency virus) testing. Routine prenatal testing includes screening for HIV, unless you choose not to have this test. HOME CARE INSTRUCTIONS   Avoid all smoking, herbs, alcohol, and unprescribed drugs. These chemicals affect the formation and growth of the baby.  Do not use any tobacco products, including cigarettes, chewing tobacco, and electronic cigarettes. If you need help quitting, ask your health care provider. You may receive counseling support and other resources to help you quit.  Follow your health care provider's instructions regarding medicine use. There are medicines that are either safe or unsafe to take during pregnancy.  Exercise only as directed by your health care provider. Experiencing uterine cramps is a good sign to stop exercising.  Continue to eat regular, healthy meals.  Wear a good support bra for breast tenderness.  Do not use hot tubs, steam rooms, or saunas.  Wear your seat belt at all times when driving.  Avoid raw meat, uncooked cheese, cat litter boxes, and soil used by cats. These carry germs that can cause birth defects in the baby.  Take your prenatal vitamins.  Take 1500-2000 mg of calcium daily starting at the 20th week of pregnancy until you deliver your baby.  Try taking a stool softener (if your health care provider approves) if you develop constipation. Eat more high-fiber foods, such as fresh vegetables or fruit and whole grains. Drink plenty of fluids to keep your urine clear or pale yellow.  Take warm sitz baths to soothe  any pain or discomfort caused by hemorrhoids. Use hemorrhoid cream if your health care provider approves.  If you develop varicose veins, wear support hose. Elevate your feet for 15 minutes, 3-4 times a day. Limit salt in your diet.  Avoid heavy lifting, wear low heel shoes, and practice good posture.  Rest with your legs elevated if you have leg cramps or low back pain.  Visit your dentist if you have not gone yet during your pregnancy. Use a soft toothbrush to brush your teeth and be gentle when you floss.  A sexual relationship may be continued unless your health care provider directs you otherwise.  Continue to go to all your prenatal visits as directed by your health care provider. SEEK MEDICAL CARE IF:   You have dizziness.  You have mild pelvic cramps, pelvic pressure, or nagging pain in the abdominal area.  You have persistent nausea, vomiting, or diarrhea.  You have a bad smelling vaginal discharge.  You have pain with urination. SEEK IMMEDIATE MEDICAL CARE IF:     You have a fever.  You are leaking fluid from your vagina.  You have spotting or bleeding from your vagina.  You have severe abdominal cramping or pain.  You have rapid weight gain or loss.  You have shortness of breath with chest pain.  You notice sudden or extreme swelling of your face, hands, ankles, feet, or legs.  You have not felt your baby move in over an hour.  You have severe headaches that do not go away with medicine.  You have vision changes.   This information is not intended to replace advice given to you by your health care provider. Make sure you discuss any questions you have with your health care provider.   Document Released: 12/17/2000 Document Revised: 01/13/2014 Document Reviewed: 02/24/2012 Elsevier Interactive Patient Education 2016 [employer]   Breastfeeding Deciding to breastfeed is one of the best choices you can make for you and your baby. A change in hormones  during pregnancy causes your breast tissue to grow and increases the number and size of your milk ducts. These hormones also allow proteins, sugars, and fats from your blood supply to make breast milk in your milk-producing glands. Hormones prevent breast milk from being released before your baby is born as well as prompt milk flow after birth. Once breastfeeding has begun, thoughts of your baby, as well as his or her sucking or crying, can stimulate the release of milk from your milk-producing glands.  BENEFITS OF BREASTFEEDING For Your Baby  Your first milk (colostrum) helps your baby's digestive system function better.  There are antibodies in your milk that help your baby fight off infections.  Your baby has a lower incidence of asthma, allergies, and sudden infant death syndrome.  The nutrients in breast milk are better for your baby than infant formulas and are designed uniquely for your baby's needs.  Breast milk improves your baby's brain development.  Your baby is less likely to develop other conditions, such as childhood obesity, asthma, or type 2 diabetes mellitus. For You  Breastfeeding helps to create a very special bond between you and your baby.  Breastfeeding is convenient. Breast milk is always available at the correct temperature and costs nothing.  Breastfeeding helps to burn calories and helps you lose the weight gained during pregnancy.  Breastfeeding makes your uterus contract to its prepregnancy size faster and slows bleeding (lochia) after you give birth.   Breastfeeding helps to lower your risk of developing type 2 diabetes mellitus, osteoporosis, and breast or ovarian cancer later in life. SIGNS THAT YOUR BABY IS HUNGRY Early Signs of Hunger  Increased alertness or activity.  Stretching.  Movement of the head from side to side.  Movement of the head and opening of the mouth when the corner of the mouth or cheek is stroked (rooting).  Increased sucking  sounds, smacking lips, cooing, sighing, or squeaking.  Hand-to-mouth movements.  Increased sucking of fingers or hands. Late Signs of Hunger  Fussing.  Intermittent crying. Extreme Signs of Hunger Signs of extreme hunger will require calming and consoling before your baby will be able to breastfeed successfully. Do not wait for the following signs of extreme hunger to occur before you initiate breastfeeding:  Restlessness.  A loud, strong cry.  Screaming. BREASTFEEDING BASICS Breastfeeding Initiation  Find a comfortable place to sit or lie down, with your neck and back well supported.  Place a pillow or rolled up blanket under your baby to bring him or her to the level   of your breast (if you are seated). Nursing pillows are specially designed to help support your arms and your baby while you breastfeed.  Make sure that your baby's abdomen is facing your abdomen.  Gently massage your breast. With your fingertips, massage from your chest wall toward your nipple in a circular motion. This encourages milk flow. You may need to continue this action during the feeding if your milk flows slowly.  Support your breast with 4 fingers underneath and your thumb above your nipple. Make sure your fingers are well away from your nipple and your baby's mouth.  Stroke your baby's lips gently with your finger or nipple.  When your baby's mouth is open wide enough, quickly bring your baby to your breast, placing your entire nipple and as much of the colored area around your nipple (areola) as possible into your baby's mouth.  More areola should be visible above your baby's upper lip than below the lower lip.  Your baby's tongue should be between his or her lower gum and your breast.  Ensure that your baby's mouth is correctly positioned around your nipple (latched). Your baby's lips should create a seal on your breast and be turned out (everted).  It is common for your baby to suck about 2-3  minutes in order to start the flow of breast milk. Latching Teaching your baby how to latch on to your breast properly is very important. An improper latch can cause nipple pain and decreased milk supply for you and poor weight gain in your baby. Also, if your baby is not latched onto your nipple properly, he or she may swallow some air during feeding. This can make your baby fussy. Burping your baby when you switch breasts during the feeding can help to get rid of the air. However, teaching your baby to latch on properly is still the best way to prevent fussiness from swallowing air while breastfeeding. Signs that your baby has successfully latched on to your nipple:  Silent tugging or silent sucking, without causing you pain.  Swallowing heard between every 3-4 sucks.  Muscle movement above and in front of his or her ears while sucking. Signs that your baby has not successfully latched on to nipple:  Sucking sounds or smacking sounds from your baby while breastfeeding.  Nipple pain. If you think your baby has not latched on correctly, slip your finger into the corner of your baby's mouth to break the suction and place it between your baby's gums. Attempt breastfeeding initiation again. Signs of Successful Breastfeeding Signs from your baby:  A gradual decrease in the number of sucks or complete cessation of sucking.  Falling asleep.  Relaxation of his or her body.  Retention of a small amount of milk in his or her mouth.  Letting go of your breast by himself or herself. Signs from you:  Breasts that have increased in firmness, weight, and size 1-3 hours after feeding.  Breasts that are softer immediately after breastfeeding.  Increased milk volume, as well as a change in milk consistency and color by the fifth day of breastfeeding.  Nipples that are not sore, cracked, or bleeding. Signs That Your Baby is Getting Enough Milk  Wetting at least 3 diapers in a 24-hour period. The  urine should be clear and pale yellow by age 5 days.  At least 3 stools in a 24-hour period by age 5 days. The stool should be soft and yellow.  At least 3 stools in a 24-hour   period by age 7 days. The stool should be seedy and yellow.  No loss of weight greater than 10% of birth weight during the first 3 days of age.  Average weight gain of 4-7 ounces (113-198 g) per week after age 4 days.  Consistent daily weight gain by age 5 days, without weight loss after the age of 2 weeks. After a feeding, your baby may spit up a small amount. This is common. BREASTFEEDING FREQUENCY AND DURATION Frequent feeding will help you make more milk and can prevent sore nipples and breast engorgement. Breastfeed when you feel the need to reduce the fullness of your breasts or when your baby shows signs of hunger. This is called "breastfeeding on demand." Avoid introducing a pacifier to your baby while you are working to establish breastfeeding (the first 4-6 weeks after your baby is born). After this time you may choose to use a pacifier. Research has shown that pacifier use during the first year of a baby's life decreases the risk of sudden infant death syndrome (SIDS). Allow your baby to feed on each breast as long as he or she wants. Breastfeed until your baby is finished feeding. When your baby unlatches or falls asleep while feeding from the first breast, offer the second breast. Because newborns are often sleepy in the first few weeks of life, you may need to awaken your baby to get him or her to feed. Breastfeeding times will vary from baby to baby. However, the following rules can serve as a guide to help you ensure that your baby is properly fed:  Newborns (babies 4 weeks of age or younger) may breastfeed every 1-3 hours.  Newborns should not go longer than 3 hours during the day or 5 hours during the night without breastfeeding.  You should breastfeed your baby a minimum of 8 times in a 24-hour period  until you begin to introduce solid foods to your baby at around 6 months of age. BREAST MILK PUMPING Pumping and storing breast milk allows you to ensure that your baby is exclusively fed your breast milk, even at times when you are unable to breastfeed. This is especially important if you are going back to work while you are still breastfeeding or when you are not able to be present during feedings. Your lactation consultant can give you guidelines on how long it is safe to store breast milk. A breast pump is a machine that allows you to pump milk from your breast into a sterile bottle. The pumped breast milk can then be stored in a refrigerator or freezer. Some breast pumps are operated by hand, while others use electricity. Ask your lactation consultant which type will work best for you. Breast pumps can be purchased, but some hospitals and breastfeeding support groups lease breast pumps on a monthly basis. A lactation consultant can teach you how to hand express breast milk, if you prefer not to use a pump. CARING FOR YOUR BREASTS WHILE YOU BREASTFEED Nipples can become dry, cracked, and sore while breastfeeding. The following recommendations can help keep your breasts moisturized and healthy:  Avoid using soap on your nipples.  Wear a supportive bra. Although not required, special nursing bras and tank tops are designed to allow access to your breasts for breastfeeding without taking off your entire bra or top. Avoid wearing underwire-style bras or extremely tight bras.  Air dry your nipples for 3-4minutes after each feeding.  Use only cotton bra pads to absorb leaked breast milk.   Leaking of breast milk between feedings is normal.  Use lanolin on your nipples after breastfeeding. Lanolin helps to maintain your skin's normal moisture barrier. If you use pure lanolin, you do not need to wash it off before feeding your baby again. Pure lanolin is not toxic to your baby. You may also hand express a  few drops of breast milk and gently massage that milk into your nipples and allow the milk to air dry. In the first few weeks after giving birth, some women experience extremely full breasts (engorgement). Engorgement can make your breasts feel heavy, warm, and tender to the touch. Engorgement peaks within 3-5 days after you give birth. The following recommendations can help ease engorgement:  Completely empty your breasts while breastfeeding or pumping. You may want to start by applying warm, moist heat (in the shower or with warm water-soaked hand towels) just before feeding or pumping. This increases circulation and helps the milk flow. If your baby does not completely empty your breasts while breastfeeding, pump any extra milk after he or she is finished.  Wear a snug bra (nursing or regular) or tank top for 1-2 days to signal your body to slightly decrease milk production.  Apply ice packs to your breasts, unless this is too uncomfortable for you.  Make sure that your baby is latched on and positioned properly while breastfeeding. If engorgement persists after 48 hours of following these recommendations, contact your health care provider or a lactation consultant. OVERALL HEALTH CARE RECOMMENDATIONS WHILE BREASTFEEDING  Eat healthy foods. Alternate between meals and snacks, eating 3 of each per day. Because what you eat affects your breast milk, some of the foods may make your baby more irritable than usual. Avoid eating these foods if you are sure that they are negatively affecting your baby.  Drink milk, fruit juice, and water to satisfy your thirst (about 10 glasses a day).  Rest often, relax, and continue to take your prenatal vitamins to prevent fatigue, stress, and anemia.  Continue breast self-awareness checks.  Avoid chewing and smoking tobacco. Chemicals from cigarettes that pass into breast milk and exposure to secondhand smoke may harm your baby.  Avoid alcohol and drug use,  including marijuana. Some medicines that may be harmful to your baby can pass through breast milk. It is important to ask your health care provider before taking any medicine, including all over-the-counter and prescription medicine as well as vitamin and herbal supplements. It is possible to become pregnant while breastfeeding. If birth control is desired, ask your health care provider about options that will be safe for your baby. SEEK MEDICAL CARE IF:  You feel like you want to stop breastfeeding or have become frustrated with breastfeeding.  You have painful breasts or nipples.  Your nipples are cracked or bleeding.  Your breasts are red, tender, or warm.  You have a swollen area on either breast.  You have a fever or chills.  You have nausea or vomiting.  You have drainage other than breast milk from your nipples.  Your breasts do not become full before feedings by the fifth day after you give birth.  You feel sad and depressed.  Your baby is too sleepy to eat well.  Your baby is having trouble sleeping.   Your baby is wetting less than 3 diapers in a 24-hour period.  Your baby has less than 3 stools in a 24-hour period.  Your baby's skin or the white part of his or her eyes becomes   yellow.   Your baby is not gaining weight by 5 days of age. SEEK IMMEDIATE MEDICAL CARE IF:  Your baby is overly tired (lethargic) and does not want to wake up and feed.  Your baby develops an unexplained fever.   This information is not intended to replace advice given to you by your health care provider. Make sure you discuss any questions you have with your health care provider.   Document Released: 12/23/2004 Document Revised: 09/13/2014 Document Reviewed: 06/16/2012 Elsevier Interactive Patient Education 2016 [employer]  

## 2015-01-29 NOTE — Progress Notes (Signed)
Reviewed tip of week with patient  

## 2015-01-29 NOTE — Progress Notes (Signed)
Subjective:  Anna Stout is a 25 y.o. G2P0010 at [redacted]w[redacted]d being seen today for ongoing prenatal care.  She is currently monitored for the following issues for this high-risk pregnancy and has Supervision of high-risk pregnancy; Gestational diabetes; and ASCUS with positive high risk HPV on her problem list.  Patient reports no complaints.  Contractions: Not present. Vag. Bleeding: None.  Movement: Present. Denies leaking of fluid.   The following portions of the patient's history were reviewed and updated as appropriate: allergies, current medications, past family history, past medical history, past social history, past surgical history and problem list. Problem list updated.  Objective:   Filed Vitals:   01/29/15 0931  BP: 128/63  Pulse: 75  Temp: 97.9 F (36.6 C)  Weight: 252 lb (114.306 kg)    Fetal Status: Fetal Heart Rate (bpm): 143 Fundal Height: 23 cm Movement: Present     General:  Alert, oriented and cooperative. Patient is in no acute distress.  Skin: Skin is warm and dry. No rash noted.   Cardiovascular: Normal heart rate noted  Respiratory: Normal respiratory effort, no problems with respiration noted  Abdomen: Soft, gravid, appropriate for gestational age. Pain/Pressure: Absent     Pelvic: Vag. Bleeding: None     Cervical exam deferred        Extremities: Normal range of motion.  Edema: None  Mental Status: Normal mood and affect. Normal behavior. Normal judgment and thought content.   Urinalysis: Urine Protein: Negative Urine Glucose: Negative No book FBS 92 this am, usually < 90, in the 80's per pt. 2 hour pp all < 120 except for yesterday 130 1 hour after eating pizza  Procedure: Pt. Is an 25 y.o. G2P0010 who presents for follow-up following an abnormal pap smear which showed ASCUS +HPV.   Patient given informed consent, signed copy in the chart, time out was performed.  Placed in lithotomy position. Cervix viewed with speculum and colposcope  after application of acetic acid.   Colposcopy adequate?  yes Acetowhite lesions?yes Punctation?yes Mosaicism?  no Abnormal vasculature?  yes Biopsies?6 o'clock ECC?no  COMMENTS: Patient was given post procedure instructions.  Results will be sent via myChart  Assessment and Plan:  Pregnancy: G2P0010 at [redacted]w[redacted]d  1. Supervision of high-risk pregnancy, second trimester Continue prenatal care.   2. Diet controlled gestational diabetes mellitus in second trimester Good control--continue diet  3. ASCUS with positive high risk HPV S/p colpo today - Surgical pathology  general obstetric precautions including but not limited to vaginal bleeding, contractions, leaking of fluid and fetal movement were reviewed in detail with the patient. Please refer to After Visit Summary for other counseling recommendations.  Return in 2 weeks (on 02/12/2015).   Reva Bores, MD

## 2015-01-31 ENCOUNTER — Telehealth: Payer: Self-pay | Admitting: General Practice

## 2015-01-31 NOTE — Telephone Encounter (Signed)
Per Dr Shawnie Pons, patient's colpo shows level 1 changes- patient needs pap in one year with cotesting. Called patient, no answer- left message stating we are trying to reach you with non urgent results, please call us back at the clinics

## 2015-02-06 NOTE — Telephone Encounter (Signed)
Patient called into front office. Informed patient of results & recommendations. Patient verbalized understanding and states a couple days after the procedure she was having a discharge and slight red tinge with something that looked like flesh or tissue but thought it was probably related to the colpo. Patient reports no symptoms since then and the baby has been moving great. Told patient that sounds fine and related to procedure. Patient verbalized understanding & had no other questions

## 2015-02-12 ENCOUNTER — Ambulatory Visit (INDEPENDENT_AMBULATORY_CARE_PROVIDER_SITE_OTHER): Payer: Medicaid Other | Admitting: Family Medicine

## 2015-02-12 VITALS — BP 121/56 | HR 73 | Temp 98.2°F | Wt 253.9 lb

## 2015-02-12 DIAGNOSIS — O0993 Supervision of high risk pregnancy, unspecified, third trimester: Secondary | ICD-10-CM

## 2015-02-12 DIAGNOSIS — R896 Abnormal cytological findings in specimens from other organs, systems and tissues: Secondary | ICD-10-CM | POA: Diagnosis not present

## 2015-02-12 DIAGNOSIS — IMO0002 Reserved for concepts with insufficient information to code with codable children: Secondary | ICD-10-CM

## 2015-02-12 DIAGNOSIS — O24410 Gestational diabetes mellitus in pregnancy, diet controlled: Secondary | ICD-10-CM | POA: Diagnosis not present

## 2015-02-12 DIAGNOSIS — Z23 Encounter for immunization: Secondary | ICD-10-CM

## 2015-02-12 LAB — CBC
HCT: 33.5 % — ABNORMAL LOW (ref 36.0–46.0)
HEMOGLOBIN: 11.3 g/dL — AB (ref 12.0–15.0)
MCH: 28.7 pg (ref 26.0–34.0)
MCHC: 33.7 g/dL (ref 30.0–36.0)
MCV: 85 fL (ref 78.0–100.0)
MPV: 9.9 fL (ref 8.6–12.4)
Platelets: 193 10*3/uL (ref 150–400)
RBC: 3.94 MIL/uL (ref 3.87–5.11)
RDW: 14 % (ref 11.5–15.5)
WBC: 10 10*3/uL (ref 4.0–10.5)

## 2015-02-12 LAB — POCT URINALYSIS DIP (DEVICE)
Bilirubin Urine: NEGATIVE
GLUCOSE, UA: NEGATIVE mg/dL
Hgb urine dipstick: NEGATIVE
Ketones, ur: NEGATIVE mg/dL
Nitrite: NEGATIVE
PH: 5.5 (ref 5.0–8.0)
PROTEIN: NEGATIVE mg/dL
Specific Gravity, Urine: 1.01 (ref 1.005–1.030)
UROBILINOGEN UA: 0.2 mg/dL (ref 0.0–1.0)

## 2015-02-12 MED ORDER — TETANUS-DIPHTH-ACELL PERTUSSIS 5-2.5-18.5 LF-MCG/0.5 IM SUSP
0.5000 mL | Freq: Once | INTRAMUSCULAR | Status: AC
  Administered 2015-02-12: 0.5 mL via INTRAMUSCULAR

## 2015-02-12 NOTE — Progress Notes (Signed)
Subjective:  Anna Stout is a 25 y.o. G2P0010 at [redacted]w[redacted]d being seen today for ongoing prenatal care.  She is currently monitored for the following issues for this high-risk pregnancy and has Supervision of high-risk pregnancy; Gestational diabetes; and ASCUS with positive high risk HPV on her problem list.  Patient reports no complaints.  Contractions: Not present. Vag. Bleeding: None.  Movement: Present. Denies leaking of fluid.   The following portions of the patient's history were reviewed and updated as appropriate: allergies, current medications, past family history, past medical history, past social history, past surgical history and problem list. Problem list updated.  Objective:   Filed Vitals:   02/12/15 1108  BP: 121/56  Pulse: 73  Temp: 98.2 F (36.8 C)  Weight: 253 lb 14.4 oz (115.168 kg)    Fetal Status: Fetal Heart Rate (bpm): 148 Fundal Height: 28 cm Movement: Present     General:  Alert, oriented and cooperative. Patient is in no acute distress.  Skin: Skin is warm and dry. No rash noted.   Cardiovascular: Normal heart rate noted  Respiratory: Normal respiratory effort, no problems with respiration noted  Abdomen: Soft, gravid, appropriate for gestational age. Pain/Pressure: Present     Pelvic: Vag. Bleeding: None     Cervical exam deferred        Extremities: Normal range of motion.  Edema: None  Mental Status: Normal mood and affect. Normal behavior. Normal judgment and thought content.   Urinalysis: Urine Protein: Negative Urine Glucose: Negative  Fasting: 2 of 9 above 95 2hr pp: reviewed > 18 values, none above 120  Assessment and Plan:  Pregnancy: G2P0010 at [redacted]w[redacted]d  1. Diet controlled gestational diabetes mellitus in second trimester -Reviewed BG log -discussed importance of dietary control -follow up with DM Education in 2 weeks, if glucose control worsening recommend starting glyburide.   2. Supervision of high-risk pregnancy, third  trimester - CBC - RPR - HIV antibody (with reflex) - Tdap (BOOSTRIX) injection 0.5 mL; Inject 0.5 mLs into the muscle once.  3. ASCUS with positive high risk HPV - discussed pap in 1 year  Preterm labor symptoms and general obstetric precautions including but not limited to vaginal bleeding, contractions, leaking of fluid and fetal movement were reviewed in detail with the patient. Please refer to After Visit Summary for other counseling recommendations.  Return in about 2 weeks (around 02/26/2015) for DM education.  Future Appointments Date Time Provider Department Center  02/14/2015 11:30 AM [facility] [place] 2 [facility]  02/26/2015 10:30 AM [facility] NURSE [facility]  03/12/2015 10:05 AM Catalina Antigua, MD [facility]    Federico Flake, MD

## 2015-02-12 NOTE — Patient Instructions (Signed)

## 2015-02-12 NOTE — Progress Notes (Signed)
C/o hip pain when lying down.

## 2015-02-13 LAB — RPR

## 2015-02-13 LAB — HIV ANTIBODY (ROUTINE TESTING W REFLEX): HIV 1&2 Ab, 4th Generation: NONREACTIVE

## 2015-02-14 ENCOUNTER — Encounter (HOSPITAL_COMMUNITY): Payer: Self-pay

## 2015-02-14 ENCOUNTER — Ambulatory Visit (HOSPITAL_COMMUNITY)
Admission: RE | Admit: 2015-02-14 | Discharge: 2015-02-14 | Disposition: A | Discharge status: Home / Self Care | Payer: Medicaid Other | Source: Ambulatory Visit | Attending: Orthopedic Surgery | Admitting: Orthopedic Surgery

## 2015-02-14 DIAGNOSIS — O24410 Gestational diabetes mellitus in pregnancy, diet controlled: Secondary | ICD-10-CM | POA: Diagnosis not present

## 2015-02-14 DIAGNOSIS — Z3A27 27 weeks gestation of pregnancy: Secondary | ICD-10-CM | POA: Insufficient documentation

## 2015-02-26 ENCOUNTER — Ambulatory Visit: Payer: Medicaid Other

## 2015-03-05 ENCOUNTER — Encounter: Payer: Medicaid Other | Attending: Obstetrics & Gynecology | Admitting: *Deleted

## 2015-03-05 ENCOUNTER — Ambulatory Visit: Payer: Medicaid Other | Admitting: *Deleted

## 2015-03-05 DIAGNOSIS — O24419 Gestational diabetes mellitus in pregnancy, unspecified control: Secondary | ICD-10-CM

## 2015-03-05 DIAGNOSIS — Z713 Dietary counseling and surveillance: Secondary | ICD-10-CM | POA: Diagnosis not present

## 2015-03-05 DIAGNOSIS — O24410 Gestational diabetes mellitus in pregnancy, diet controlled: Secondary | ICD-10-CM | POA: Diagnosis present

## 2015-03-05 NOTE — Progress Notes (Signed)
Patient presents for review of glucose readings. FBS 89-114, 2hpp breakfast 81-131, 2hpp lunch 81-100, 2hpp dinner 87-133 (pasta). Majority of readings WNL, no change in treatment recommended.

## 2015-03-12 ENCOUNTER — Encounter: Payer: Self-pay | Admitting: Obstetrics and Gynecology

## 2015-03-12 ENCOUNTER — Ambulatory Visit (INDEPENDENT_AMBULATORY_CARE_PROVIDER_SITE_OTHER): Payer: Medicaid Other | Admitting: Obstetrics and Gynecology

## 2015-03-12 VITALS — BP 115/48 | HR 71 | Temp 98.3°F | Wt 253.9 lb

## 2015-03-12 DIAGNOSIS — R896 Abnormal cytological findings in specimens from other organs, systems and tissues: Secondary | ICD-10-CM

## 2015-03-12 DIAGNOSIS — O24410 Gestational diabetes mellitus in pregnancy, diet controlled: Secondary | ICD-10-CM

## 2015-03-12 DIAGNOSIS — IMO0002 Reserved for concepts with insufficient information to code with codable children: Secondary | ICD-10-CM

## 2015-03-12 DIAGNOSIS — O0993 Supervision of high risk pregnancy, unspecified, third trimester: Secondary | ICD-10-CM

## 2015-03-12 LAB — POCT URINALYSIS DIP (DEVICE)
BILIRUBIN URINE: NEGATIVE
GLUCOSE, UA: NEGATIVE mg/dL
Hgb urine dipstick: NEGATIVE
KETONES UR: NEGATIVE mg/dL
Leukocytes, UA: NEGATIVE
NITRITE: NEGATIVE
PROTEIN: NEGATIVE mg/dL
Specific Gravity, Urine: 1.015 (ref 1.005–1.030)
Urobilinogen, UA: 0.2 mg/dL (ref 0.0–1.0)
pH: 6.5 (ref 5.0–8.0)

## 2015-03-12 NOTE — Progress Notes (Signed)
Subjective:  Anna Stout is a 25 y.o. G2P0010 at 6250w5d being seen today for ongoing prenatal care.  She is currently monitored for the following issues for this high-risk pregnancy and has Supervision of high-risk pregnancy; Gestational diabetes; and ASCUS with positive high risk HPV on her problem list.  Patient reports no complaints.  Contractions: Not present. Vag. Bleeding: None.  Movement: Present. Denies leaking of fluid.   The following portions of the patient's history were reviewed and updated as appropriate: allergies, current medications, past family history, past medical history, past social history, past surgical history and problem list. Problem list updated.  Objective:   Filed Vitals:   03/12/15 1051  BP: 115/48  Pulse: 71  Temp: 98.3 F (36.8 C)  Weight: 253 lb 14.4 oz (115.168 kg)    Fetal Status: Fetal Heart Rate (bpm): 135   Movement: Present     General:  Alert, oriented and cooperative. Patient is in no acute distress.  Skin: Skin is warm and dry. No rash noted.   Cardiovascular: Normal heart rate noted  Respiratory: Normal respiratory effort, no problems with respiration noted  Abdomen: Soft, gravid, appropriate for gestational age. Pain/Pressure: Present     Pelvic: Vag. Bleeding: None Vag D/C Character: White   Cervical exam deferred        Extremities: Normal range of motion.  Edema: Trace  Mental Status: Normal mood and affect. Normal behavior. Normal judgment and thought content.   Urinalysis:      Assessment and Plan:  Pregnancy: G2P0010 at 5350w5d  1. Supervision of high-risk pregnancy, third trimester   2. Diet controlled gestational diabetes mellitus in second trimester Patient did not bring log book but admits that fasting are typically in the 8-'s except for this morning which was 105 as she consumed pasta and [employer] yesterday evening.  Her pp are reported to be 90-100's Encouraged her to adhere to diabetic  diet Encouraged her to continue daily waks Follow up growth on 3/8  3. ASCUS with positive high risk HPV   Preterm labor symptoms and general obstetric precautions including but not limited to vaginal bleeding, contractions, leaking of fluid and fetal movement were reviewed in detail with the patient. Please refer to After Visit Summary for other counseling recommendations.  Return in about 2 weeks (around 03/26/2015).   Catalina AntiguaPeggy Anelis Hrivnak, MD

## 2015-03-12 NOTE — Progress Notes (Signed)
Educated pt on Skin to Skin  

## 2015-03-14 ENCOUNTER — Other Ambulatory Visit (HOSPITAL_COMMUNITY): Payer: Self-pay | Admitting: Maternal and Fetal Medicine

## 2015-03-14 ENCOUNTER — Encounter (HOSPITAL_COMMUNITY): Payer: Self-pay

## 2015-03-14 ENCOUNTER — Ambulatory Visit (HOSPITAL_COMMUNITY)
Admission: RE | Admit: 2015-03-14 | Discharge: 2015-03-14 | Disposition: A | Discharge status: Home / Self Care | Payer: Medicaid Other | Source: Ambulatory Visit | Attending: Obstetrics and Gynecology | Admitting: Obstetrics and Gynecology

## 2015-03-14 DIAGNOSIS — O99212 Obesity complicating pregnancy, second trimester: Secondary | ICD-10-CM | POA: Diagnosis not present

## 2015-03-14 DIAGNOSIS — Z3A31 31 weeks gestation of pregnancy: Secondary | ICD-10-CM | POA: Insufficient documentation

## 2015-03-14 DIAGNOSIS — O24410 Gestational diabetes mellitus in pregnancy, diet controlled: Secondary | ICD-10-CM

## 2015-03-14 HISTORY — DX: Gestational diabetes mellitus in pregnancy, unspecified control: O24.419

## 2015-03-15 ENCOUNTER — Other Ambulatory Visit (HOSPITAL_COMMUNITY): Payer: Self-pay | Admitting: *Deleted

## 2015-03-15 DIAGNOSIS — O365990 Maternal care for other known or suspected poor fetal growth, unspecified trimester, not applicable or unspecified: Secondary | ICD-10-CM

## 2015-03-21 ENCOUNTER — Encounter (HOSPITAL_COMMUNITY): Payer: Self-pay

## 2015-03-21 ENCOUNTER — Ambulatory Visit (HOSPITAL_COMMUNITY)
Admission: RE | Admit: 2015-03-21 | Discharge: 2015-03-21 | Disposition: A | Discharge status: Home / Self Care | Payer: Medicaid Other | Source: Ambulatory Visit | Attending: Obstetrics and Gynecology | Admitting: Obstetrics and Gynecology

## 2015-03-21 ENCOUNTER — Other Ambulatory Visit (HOSPITAL_COMMUNITY): Payer: Self-pay | Admitting: Obstetrics and Gynecology

## 2015-03-21 DIAGNOSIS — O99213 Obesity complicating pregnancy, third trimester: Secondary | ICD-10-CM | POA: Diagnosis present

## 2015-03-21 DIAGNOSIS — O365930 Maternal care for other known or suspected poor fetal growth, third trimester, not applicable or unspecified: Secondary | ICD-10-CM | POA: Insufficient documentation

## 2015-03-21 DIAGNOSIS — O24419 Gestational diabetes mellitus in pregnancy, unspecified control: Secondary | ICD-10-CM | POA: Insufficient documentation

## 2015-03-21 DIAGNOSIS — Z3A32 32 weeks gestation of pregnancy: Secondary | ICD-10-CM

## 2015-03-21 DIAGNOSIS — O24410 Gestational diabetes mellitus in pregnancy, diet controlled: Secondary | ICD-10-CM

## 2015-03-21 DIAGNOSIS — O365990 Maternal care for other known or suspected poor fetal growth, unspecified trimester, not applicable or unspecified: Secondary | ICD-10-CM

## 2015-03-26 ENCOUNTER — Encounter: Payer: Self-pay | Admitting: Family Medicine

## 2015-03-26 ENCOUNTER — Ambulatory Visit (INDEPENDENT_AMBULATORY_CARE_PROVIDER_SITE_OTHER): Payer: Medicaid Other | Admitting: Family Medicine

## 2015-03-26 VITALS — BP 123/69 | HR 77 | Temp 98.0°F | Wt 251.0 lb

## 2015-03-26 DIAGNOSIS — O24410 Gestational diabetes mellitus in pregnancy, diet controlled: Secondary | ICD-10-CM | POA: Diagnosis not present

## 2015-03-26 DIAGNOSIS — O365930 Maternal care for other known or suspected poor fetal growth, third trimester, not applicable or unspecified: Secondary | ICD-10-CM

## 2015-03-26 DIAGNOSIS — O365990 Maternal care for other known or suspected poor fetal growth, unspecified trimester, not applicable or unspecified: Secondary | ICD-10-CM | POA: Insufficient documentation

## 2015-03-26 DIAGNOSIS — O0993 Supervision of high risk pregnancy, unspecified, third trimester: Secondary | ICD-10-CM

## 2015-03-26 LAB — POCT URINALYSIS DIP (DEVICE)
BILIRUBIN URINE: NEGATIVE
Glucose, UA: NEGATIVE mg/dL
Hgb urine dipstick: NEGATIVE
Ketones, ur: NEGATIVE mg/dL
NITRITE: NEGATIVE
PH: 7 (ref 5.0–8.0)
Protein, ur: NEGATIVE mg/dL
Specific Gravity, Urine: 1.015 (ref 1.005–1.030)
UROBILINOGEN UA: 0.2 mg/dL (ref 0.0–1.0)

## 2015-03-26 NOTE — Progress Notes (Signed)
Subjective:  Anna Stout is a 25 y.o. G2P0010 at 2129w5d being seen today for ongoing prenatal care.  She is currently monitored for the following issues for this high-risk pregnancy and has Supervision of high-risk pregnancy; Gestational diabetes; ASCUS with positive high risk HPV; and Question of IUGR (intrauterine growth restriction) affecting care of mother on her problem list.  Patient reports no complaints.  Contractions: Irregular. Vag. Bleeding: None.  Movement: Present. Denies leaking of fluid.   The following portions of the patient's history were reviewed and updated as appropriate: allergies, current medications, past family history, past medical history, past social history, past surgical history and problem list. Problem list updated.  Objective:   Filed Vitals:   03/26/15 1056  BP: 123/69  Pulse: 77  Temp: 98 F (36.7 C)  Weight: 251 lb (113.853 kg)    Fetal Status: Fetal Heart Rate (bpm): 130   Movement: Present Fundal Height 31 cm     General:  Alert, oriented and cooperative. Patient is in no acute distress.  Skin: Skin is warm and dry. No rash noted.   Cardiovascular: Normal heart rate noted  Respiratory: Normal respiratory effort, no problems with respiration noted  Abdomen: Soft, gravid, appropriate for gestational age. Pain/Pressure: Present     Pelvic: Vag. Bleeding: None     Cervical exam deferred        Extremities: Normal range of motion.     Mental Status: Normal mood and affect. Normal behavior. Normal judgment and thought content.   Urinalysis: Urine Protein: Negative Urine Glucose: Negative FBS 84-120 (6 of 11 are out of range) 2 hour pp 73-120 none are out of range Assessment and Plan:  Pregnancy: G2P0010 at 4629w5d  1. Supervision of high-risk pregnancy, third trimester Continue prenatal care.  2. Diet controlled gestational diabetes mellitus in third trimester Continue diet - had tried eating more due to fetal size.  She has  gained 30 lbs this pregnancy - resume her diabetic diet. Add bedtime snack with protein and exercise.  3. IUGR (intrauterine growth restriction) affecting care of mother, third trimester, not applicable or unspecified fetus Weekly BPP's with MFM and recommendations about deliver  Preterm labor symptoms and general obstetric precautions including but not limited to vaginal bleeding, contractions, leaking of fluid and fetal movement were reviewed in detail with the patient. Please refer to After Visit Summary for other counseling recommendations.  Return in 2 weeks (on 04/09/2015).   Reva Boresanya S Newell Wafer, MD

## 2015-03-26 NOTE — Patient Instructions (Addendum)
Gestational Diabetes Mellitus Gestational diabetes mellitus, often simply referred to as gestational diabetes, is a type of diabetes that some women develop during pregnancy. In gestational diabetes, the pancreas does not make enough insulin (a hormone), the cells are less responsive to the insulin that is made (insulin resistance), or both.Normally, insulin moves sugars from food into the tissue cells. The tissue cells use the sugars for energy. The lack of insulin or the lack of normal response to insulin causes excess sugars to build up in the blood instead of going into the tissue cells. As a result, high blood sugar (hyperglycemia) develops. The effect of high sugar (glucose) levels can cause many problems.  RISK FACTORS You have an increased chance of developing gestational diabetes if you have a family history of diabetes and also have one or more of the following risk factors:  A body mass index over 30 (obesity).  A previous pregnancy with gestational diabetes.  An older age at the time of pregnancy. If blood glucose levels are kept in the normal range during pregnancy, women can have a healthy pregnancy. If your blood glucose levels are not well controlled, there may be risks to you, your unborn baby (fetus), your labor and delivery, or your newborn baby.  SYMPTOMS  If symptoms are experienced, they are much like symptoms you would normally expect during pregnancy. The symptoms of gestational diabetes include:   Increased thirst (polydipsia).  Increased urination (polyuria).  Increased urination during the night (nocturia).  Weight loss. This weight loss may be rapid.  Frequent, recurring infections.  Tiredness (fatigue).  Weakness.  Vision changes, such as blurred vision.  Fruity smell to your breath.  Abdominal pain. DIAGNOSIS Diabetes is diagnosed when blood glucose levels are increased. Your blood glucose level may be checked by one or more of the following blood  tests:  A fasting blood glucose test. You will not be allowed to eat for at least 8 hours before a blood sample is taken.  A random blood glucose test. Your blood glucose is checked at any time of the day regardless of when you ate.  An oral glucose tolerance test (OGTT). Your blood glucose is measured after you have not eaten (fasted) for 1-3 hours and then after you drink a glucose-containing beverage. Since the hormones that cause insulin resistance are highest at about 24-28 weeks of a pregnancy, an OGTT is usually performed during that time. If you have risk factors, you may be screened for undiagnosed type 2 diabetes at your first prenatal visit. TREATMENT  Gestational diabetes should be managed first with diet and exercise. Medicines may be added only if they are needed.  You will need to take diabetes medicine or insulin daily to keep blood glucose levels in the desired range.  You will need to match insulin dosing with exercise and healthy food choices. If you have gestational diabetes, your treatment goal is to maintain the following blood glucose levels:  Before meals (preprandial): at or below 95 mg/dL.  After meals (postprandial):  One hour after a meal: at or below 140 mg/dL.  Two hours after a meal: at or below 120 mg/dL. If you have pre-existing type 1 or type 2 diabetes, your treatment goal is to maintain the following blood glucose levels:  Before meals, at bedtime, and overnight: 60-99 mg/dL.  After meals: peak of 100-129 mg/dL. HOME CARE INSTRUCTIONS   Have your hemoglobin A1c level checked twice a year.  Perform daily blood glucose monitoring as directed by   your health care provider. It is common to perform frequent blood glucose monitoring.  Monitor urine ketones when you are ill and as directed by your health care provider.  Take your diabetes medicine and insulin as directed by your health care provider to maintain your blood glucose level in the desired  range.  Never run out of diabetes medicine or insulin. It is needed every day.  Adjust insulin based on your intake of carbohydrates. Carbohydrates can raise blood glucose levels but need to be included in your diet. Carbohydrates provide vitamins, minerals, and fiber which are an essential part of a healthy diet. Carbohydrates are found in fruits, vegetables, whole grains, dairy products, legumes, and foods containing added sugars.  Eat healthy foods. Alternate 3 meals with 3 snacks.  Maintain a healthy weight gain. The usual total expected weight gain varies according to your prepregnancy body mass index (BMI).  Carry a medical alert card or wear your medical alert jewelry.  Carry a 15-gram carbohydrate snack with you at all times to treat low blood glucose (hypoglycemia). Some examples of 15-gram carbohydrate snacks include:  Glucose tablets, 3 or 4.  Glucose gel, 15-gram tube.  Raisins, 2 tablespoons (24 g).  Jelly beans, 6.  Animal crackers, 8.  Fruit juice, regular soda, or low-fat milk, 4 ounces (120 mL).  Gummy treats, 9.  Recognize hypoglycemia. Hypoglycemia during pregnancy occurs with blood glucose levels of 60 mg/dL and below. The risk for hypoglycemia increases when fasting or skipping meals, during or after intense exercise, and during sleep. Hypoglycemia symptoms can include:  Tremors or shakes.  Decreased ability to concentrate.  Sweating.  Increased heart rate.  Headache.  Dry mouth.  Hunger.  Irritability.  Anxiety.  Restless sleep.  Altered speech or coordination.  Confusion.  Treat hypoglycemia promptly. If you are alert and able to safely swallow, follow the 15:15 rule:  Take 15-20 grams of rapid-acting glucose or carbohydrate. Rapid-acting options include glucose gel, glucose tablets, or 4 ounces (120 mL) of fruit juice, regular soda, or low-fat milk.  Check your blood glucose level 15 minutes after taking the glucose.  Take 15-20  grams more of glucose if the repeat blood glucose level is still 70 mg/dL or below.  Eat a meal or snack within 1 hour once blood glucose levels return to normal.  Be alert to polyuria (excess urination) and polydipsia (excess thirst) which are early signs of hyperglycemia. An early awareness of hyperglycemia allows for prompt treatment. Treat hyperglycemia as directed by your health care provider.  Engage in at least 30 minutes of physical activity a day or as directed by your health care provider. Ten minutes of physical activity timed 30 minutes after each meal is encouraged to control postprandial blood glucose levels.  Adjust your insulin dosing and food intake as needed if you start a new exercise or sport.  Follow your sick-day plan at any time you are unable to eat or drink as usual.  Avoid tobacco and alcohol use.  Keep all follow-up visits as directed by your health care provider.  Follow the advice of your health care provider regarding your prenatal and post-delivery (postpartum) appointments, meal planning, exercise, medicines, vitamins, blood tests, other medical tests, and physical activities.  Perform daily skin and foot care. Examine your skin and feet daily for cuts, bruises, redness, nail problems, bleeding, blisters, or sores.  Brush your teeth and gums at least twice a day and floss at least once a day. Follow up with your dentist   regularly.  Schedule an eye exam during the first trimester of your pregnancy or as directed by your health care provider.  Share your diabetes management plan with your workplace or school.  Stay up-to-date with immunizations.  Learn to manage stress.  Obtain ongoing diabetes education and support as needed.  Learn about and consider breastfeeding your baby.  You should have your blood sugar level checked 6-12 weeks after delivery. This is done with an oral glucose tolerance test (OGTT). SEEK MEDICAL CARE IF:   You are unable to  eat food or drink fluids for more than 6 hours.  You have nausea and vomiting for more than 6 hours.  You have a blood glucose level of 200 mg/dL and you have ketones in your urine.  There is a change in mental status.  You develop vision problems.  You have a persistent headache.  You have upper abdominal pain or discomfort.  You develop an additional serious illness.  You have diarrhea for more than 6 hours.  You have been sick or have had a fever for a couple of days and are not getting better. SEEK IMMEDIATE MEDICAL CARE IF:   You have difficulty breathing.  You no longer feel the baby moving.  You are bleeding or have discharge from your vagina.  You start having premature contractions or labor. MAKE SURE YOU:  Understand these instructions.  Will watch your condition.  Will get help right away if you are not doing well or get worse.   This information is not intended to replace advice given to you by your health care provider. Make sure you discuss any questions you have with your health care provider.   Document Released: 03/31/2000 Document Revised: 01/13/2014 Document Reviewed: 07/22/2011 Elsevier Interactive Patient Education 2016 [employer]  Breastfeeding Deciding to breastfeed is one of the best choices you can make for you and your baby. A change in hormones during pregnancy causes your breast tissue to grow and increases the number and size of your milk ducts. These hormones also allow proteins, sugars, and fats from your blood supply to make breast milk in your milk-producing glands. Hormones prevent breast milk from being released before your baby is born as well as prompt milk flow after birth. Once breastfeeding has begun, thoughts of your baby, as well as his or her sucking or crying, can stimulate the release of milk from your milk-producing glands.  BENEFITS OF BREASTFEEDING For Your Baby  Your first milk (colostrum) helps your baby's digestive  system function better.  There are antibodies in your milk that help your baby fight off infections.  Your baby has a lower incidence of asthma, allergies, and sudden infant death syndrome.  The nutrients in breast milk are better for your baby than infant formulas and are designed uniquely for your baby's needs.  Breast milk improves your baby's brain development.  Your baby is less likely to develop other conditions, such as childhood obesity, asthma, or type 2 diabetes mellitus. For You  Breastfeeding helps to create a very special bond between you and your baby.  Breastfeeding is convenient. Breast milk is always available at the correct temperature and costs nothing.  Breastfeeding helps to burn calories and helps you lose the weight gained during pregnancy.  Breastfeeding makes your uterus contract to its prepregnancy size faster and slows bleeding (lochia) after you give birth.   Breastfeeding helps to lower your risk of developing type 2 diabetes mellitus, osteoporosis, and breast or ovarian cancer   later in life. SIGNS THAT YOUR BABY IS HUNGRY Early Signs of Hunger  Increased alertness or activity.  Stretching.  Movement of the head from side to side.  Movement of the head and opening of the mouth when the corner of the mouth or cheek is stroked (rooting).  Increased sucking sounds, smacking lips, cooing, sighing, or squeaking.  Hand-to-mouth movements.  Increased sucking of fingers or hands. Late Signs of Hunger  Fussing.  Intermittent crying. Extreme Signs of Hunger Signs of extreme hunger will require calming and consoling before your baby will be able to breastfeed successfully. Do not wait for the following signs of extreme hunger to occur before you initiate breastfeeding:  Restlessness.  A loud, strong cry.  Screaming. BREASTFEEDING BASICS Breastfeeding Initiation  Find a comfortable place to sit or lie down, with your neck and back well  supported.  Place a pillow or rolled up blanket under your baby to bring him or her to the level of your breast (if you are seated). Nursing pillows are specially designed to help support your arms and your baby while you breastfeed.  Make sure that your baby's abdomen is facing your abdomen.  Gently massage your breast. With your fingertips, massage from your chest wall toward your nipple in a circular motion. This encourages milk flow. You may need to continue this action during the feeding if your milk flows slowly.  Support your breast with 4 fingers underneath and your thumb above your nipple. Make sure your fingers are well away from your nipple and your baby's mouth.  Stroke your baby's lips gently with your finger or nipple.  When your baby's mouth is open wide enough, quickly bring your baby to your breast, placing your entire nipple and as much of the colored area around your nipple (areola) as possible into your baby's mouth.  More areola should be visible above your baby's upper lip than below the lower lip.  Your baby's tongue should be between his or her lower gum and your breast.  Ensure that your baby's mouth is correctly positioned around your nipple (latched). Your baby's lips should create a seal on your breast and be turned out (everted).  It is common for your baby to suck about 2-3 minutes in order to start the flow of breast milk. Latching Teaching your baby how to latch on to your breast properly is very important. An improper latch can cause nipple pain and decreased milk supply for you and poor weight gain in your baby. Also, if your baby is not latched onto your nipple properly, he or she may swallow some air during feeding. This can make your baby fussy. Burping your baby when you switch breasts during the feeding can help to get rid of the air. However, teaching your baby to latch on properly is still the best way to prevent fussiness from swallowing air while  breastfeeding. Signs that your baby has successfully latched on to your nipple:  Silent tugging or silent sucking, without causing you pain.  Swallowing heard between every 3-4 sucks.  Muscle movement above and in front of his or her ears while sucking. Signs that your baby has not successfully latched on to nipple:  Sucking sounds or smacking sounds from your baby while breastfeeding.  Nipple pain. If you think your baby has not latched on correctly, slip your finger into the corner of your baby's mouth to break the suction and place it between your baby's gums. Attempt breastfeeding initiation again. Signs   of Successful Breastfeeding Signs from your baby:  A gradual decrease in the number of sucks or complete cessation of sucking.  Falling asleep.  Relaxation of his or her body.  Retention of a small amount of milk in his or her mouth.  Letting go of your breast by himself or herself. Signs from you:  Breasts that have increased in firmness, weight, and size 1-3 hours after feeding.  Breasts that are softer immediately after breastfeeding.  Increased milk volume, as well as a change in milk consistency and color by the fifth day of breastfeeding.  Nipples that are not sore, cracked, or bleeding. Signs That Your Baby is Getting Enough Milk  Wetting at least 3 diapers in a 24-hour period. The urine should be clear and pale yellow by age 5 days.  At least 3 stools in a 24-hour period by age 5 days. The stool should be soft and yellow.  At least 3 stools in a 24-hour period by age 7 days. The stool should be seedy and yellow.  No loss of weight greater than 10% of birth weight during the first 3 days of age.  Average weight gain of 4-7 ounces (113-198 g) per week after age 4 days.  Consistent daily weight gain by age 5 days, without weight loss after the age of 2 weeks. After a feeding, your baby may spit up a small amount. This is common. BREASTFEEDING FREQUENCY AND  DURATION Frequent feeding will help you make more milk and can prevent sore nipples and breast engorgement. Breastfeed when you feel the need to reduce the fullness of your breasts or when your baby shows signs of hunger. This is called "breastfeeding on demand." Avoid introducing a pacifier to your baby while you are working to establish breastfeeding (the first 4-6 weeks after your baby is born). After this time you may choose to use a pacifier. Research has shown that pacifier use during the first year of a baby's life decreases the risk of sudden infant death syndrome (SIDS). Allow your baby to feed on each breast as long as he or she wants. Breastfeed until your baby is finished feeding. When your baby unlatches or falls asleep while feeding from the first breast, offer the second breast. Because newborns are often sleepy in the first few weeks of life, you may need to awaken your baby to get him or her to feed. Breastfeeding times will vary from baby to baby. However, the following rules can serve as a guide to help you ensure that your baby is properly fed:  Newborns (babies 4 weeks of age or younger) may breastfeed every 1-3 hours.  Newborns should not go longer than 3 hours during the day or 5 hours during the night without breastfeeding.  You should breastfeed your baby a minimum of 8 times in a 24-hour period until you begin to introduce solid foods to your baby at around 6 months of age. BREAST MILK PUMPING Pumping and storing breast milk allows you to ensure that your baby is exclusively fed your breast milk, even at times when you are unable to breastfeed. This is especially important if you are going back to work while you are still breastfeeding or when you are not able to be present during feedings. Your lactation consultant can give you guidelines on how long it is safe to store breast milk. A breast pump is a machine that allows you to pump milk from your breast into a sterile bottle.  The pumped   breast milk can then be stored in a refrigerator or freezer. Some breast pumps are operated by hand, while others use electricity. Ask your lactation consultant which type will work best for you. Breast pumps can be purchased, but some hospitals and breastfeeding support groups lease breast pumps on a monthly basis. A lactation consultant can teach you how to hand express breast milk, if you prefer not to use a pump. CARING FOR YOUR BREASTS WHILE YOU BREASTFEED Nipples can become dry, cracked, and sore while breastfeeding. The following recommendations can help keep your breasts moisturized and healthy:  Avoid using soap on your nipples.  Wear a supportive bra. Although not required, special nursing bras and tank tops are designed to allow access to your breasts for breastfeeding without taking off your entire bra or top. Avoid wearing underwire-style bras or extremely tight bras.  Air dry your nipples for 3-4minutes after each feeding.  Use only cotton bra pads to absorb leaked breast milk. Leaking of breast milk between feedings is normal.  Use lanolin on your nipples after breastfeeding. Lanolin helps to maintain your skin's normal moisture barrier. If you use pure lanolin, you do not need to wash it off before feeding your baby again. Pure lanolin is not toxic to your baby. You may also hand express a few drops of breast milk and gently massage that milk into your nipples and allow the milk to air dry. In the first few weeks after giving birth, some women experience extremely full breasts (engorgement). Engorgement can make your breasts feel heavy, warm, and tender to the touch. Engorgement peaks within 3-5 days after you give birth. The following recommendations can help ease engorgement:  Completely empty your breasts while breastfeeding or pumping. You may want to start by applying warm, moist heat (in the shower or with warm water-soaked hand towels) just before feeding or pumping.  This increases circulation and helps the milk flow. If your baby does not completely empty your breasts while breastfeeding, pump any extra milk after he or she is finished.  Wear a snug bra (nursing or regular) or tank top for 1-2 days to signal your body to slightly decrease milk production.  Apply ice packs to your breasts, unless this is too uncomfortable for you.  Make sure that your baby is latched on and positioned properly while breastfeeding. If engorgement persists after 48 hours of following these recommendations, contact your health care provider or a lactation consultant. OVERALL HEALTH CARE RECOMMENDATIONS WHILE BREASTFEEDING  Eat healthy foods. Alternate between meals and snacks, eating 3 of each per day. Because what you eat affects your breast milk, some of the foods may make your baby more irritable than usual. Avoid eating these foods if you are sure that they are negatively affecting your baby.  Drink milk, fruit juice, and water to satisfy your thirst (about 10 glasses a day).  Rest often, relax, and continue to take your prenatal vitamins to prevent fatigue, stress, and anemia.  Continue breast self-awareness checks.  Avoid chewing and smoking tobacco. Chemicals from cigarettes that pass into breast milk and exposure to secondhand smoke may harm your baby.  Avoid alcohol and drug use, including marijuana. Some medicines that may be harmful to your baby can pass through breast milk. It is important to ask your health care provider before taking any medicine, including all over-the-counter and prescription medicine as well as vitamin and herbal supplements. It is possible to become pregnant while breastfeeding. If birth control is desired, ask   your health care provider about options that will be safe for your baby. SEEK MEDICAL CARE IF:  You feel like you want to stop breastfeeding or have become frustrated with breastfeeding.  You have painful breasts or  nipples.  Your nipples are cracked or bleeding.  Your breasts are red, tender, or warm.  You have a swollen area on either breast.  You have a fever or chills.  You have nausea or vomiting.  You have drainage other than breast milk from your nipples.  Your breasts do not become full before feedings by the fifth day after you give birth.  You feel sad and depressed.  Your baby is too sleepy to eat well.  Your baby is having trouble sleeping.   Your baby is wetting less than 3 diapers in a 24-hour period.  Your baby has less than 3 stools in a 24-hour period.  Your baby's skin or the white part of his or her eyes becomes yellow.   Your baby is not gaining weight by 17 days of age. SEEK IMMEDIATE MEDICAL CARE IF:  Your baby is overly tired (lethargic) and does not want to wake up and feed.  Your baby develops an unexplained fever.   This information is not intended to replace advice given to you by your health care provider. Make sure you discuss any questions you have with your health care provider.   Document Released: 12/23/2004 Document Revised: 09/13/2014 Document Reviewed: 06/16/2012 Elsevier Interactive Patient Education [employer] Contraception Choices Contraception (birth control) is the use of any methods or devices to prevent pregnancy. Below are some methods to help avoid pregnancy. HORMONAL METHODS   Contraceptive implant. This is a thin, plastic tube containing progesterone hormone. It does not contain estrogen hormone. Your health care provider inserts the tube in the inner part of the upper arm. The tube can remain in place for up to 3 years. After 3 years, the implant must be removed. The implant prevents the ovaries from releasing an egg (ovulation), thickens the cervical mucus to prevent sperm from entering the uterus, and thins the lining of the inside of the uterus.  Progesterone-only injections. These injections are given every 3 months by  your health care provider to prevent pregnancy. This synthetic progesterone hormone stops the ovaries from releasing eggs. It also thickens cervical mucus and changes the uterine lining. This makes it harder for sperm to survive in the uterus.  Birth control pills. These pills contain estrogen and progesterone hormone. They work by preventing the ovaries from releasing eggs (ovulation). They also cause the cervical mucus to thicken, preventing the sperm from entering the uterus. Birth control pills are prescribed by a health care provider.Birth control pills can also be used to treat heavy periods.  Minipill. This type of birth control pill contains only the progesterone hormone. They are taken every day of each month and must be prescribed by your health care provider.  Birth control patch. The patch contains hormones similar to those in birth control pills. It must be changed once a week and is prescribed by a health care provider.  Vaginal ring. The ring contains hormones similar to those in birth control pills. It is left in the vagina for 3 weeks, removed for 1 week, and then a new one is put back in place. The patient must be comfortable inserting and removing the ring from the vagina.A health care provider's prescription is necessary.  Emergency contraception. Emergency contraceptives prevent pregnancy after unprotected sexual  intercourse. This pill can be taken right after sex or up to 5 days after unprotected sex. It is most effective the sooner you take the pills after having sexual intercourse. Most emergency contraceptive pills are available without a prescription. Check with your pharmacist. Do not use emergency contraception as your only form of birth control. BARRIER METHODS   Female condom. This is a thin sheath (latex or rubber) that is worn over the penis during sexual intercourse. It can be used with spermicide to increase effectiveness.  Female condom. This is a soft, loose-fitting  sheath that is put into the vagina before sexual intercourse.  Diaphragm. This is a soft, latex, dome-shaped barrier that must be fitted by a health care provider. It is inserted into the vagina, along with a spermicidal jelly. It is inserted before intercourse. The diaphragm should be left in the vagina for 6 to 8 hours after intercourse.  Cervical cap. This is a round, soft, latex or plastic cup that fits over the cervix and must be fitted by a health care provider. The cap can be left in place for up to 48 hours after intercourse.  Sponge. This is a soft, circular piece of polyurethane foam. The sponge has spermicide in it. It is inserted into the vagina after wetting it and before sexual intercourse.  Spermicides. These are chemicals that kill or block sperm from entering the cervix and uterus. They come in the form of creams, jellies, suppositories, foam, or tablets. They do not require a prescription. They are inserted into the vagina with an applicator before having sexual intercourse. The process must be repeated every time you have sexual intercourse. INTRAUTERINE CONTRACEPTION  Intrauterine device (IUD). This is a T-shaped device that is put in a woman's uterus during a menstrual period to prevent pregnancy. There are 2 types:  Copper IUD. This type of IUD is wrapped in copper wire and is placed inside the uterus. Copper makes the uterus and fallopian tubes produce a fluid that kills sperm. It can stay in place for 10 years.  Hormone IUD. This type of IUD contains the hormone progestin (synthetic progesterone). The hormone thickens the cervical mucus and prevents sperm from entering the uterus, and it also thins the uterine lining to prevent implantation of a fertilized egg. The hormone can weaken or kill the sperm that get into the uterus. It can stay in place for 3-5 years, depending on which type of IUD is used. PERMANENT METHODS OF CONTRACEPTION  Female tubal ligation. This is when  the woman's fallopian tubes are surgically sealed, tied, or blocked to prevent the egg from traveling to the uterus.  Hysteroscopic sterilization. This involves placing a small coil or insert into each fallopian tube. Your doctor uses a technique called hysteroscopy to do the procedure. The device causes scar tissue to form. This results in permanent blockage of the fallopian tubes, so the sperm cannot fertilize the egg. It takes about 3 months after the procedure for the tubes to become blocked. You must use another form of birth control for these 3 months.  Female sterilization. This is when the female has the tubes that carry sperm tied off (vasectomy).This blocks sperm from entering the vagina during sexual intercourse. After the procedure, the man can still ejaculate fluid (semen). NATURAL PLANNING METHODS  Natural family planning. This is not having sexual intercourse or using a barrier method (condom, diaphragm, cervical cap) on days the woman could become pregnant.  Calendar method. This is keeping track of  the length of each menstrual cycle and identifying when you are fertile.  Ovulation method. This is avoiding sexual intercourse during ovulation.  Symptothermal method. This is avoiding sexual intercourse during ovulation, using a thermometer and ovulation symptoms.  Post-ovulation method. This is timing sexual intercourse after you have ovulated. Regardless of which type or method of contraception you choose, it is important that you use condoms to protect against the transmission of sexually transmitted infections (STIs). Talk with your health care provider about which form of contraception is most appropriate for you.   This information is not intended to replace advice given to you by your health care provider. Make sure you discuss any questions you have with your health care provider.   Document Released: 12/23/2004 Document Revised: 12/28/2012 Document Reviewed:  06/17/2012 Elsevier Interactive Patient Education 2016 [employer]. AREA PEDIATRIC/FAMILY PRACTICE PHYSICIANS  [facility] [address] [place], [place] [address] Phone - [phone]   Fax - [phone]  [facility] AMOS [address] [place], [place]  [address] Phone - [phone]   Fax - [phone]  [facility] [address] [place], [place]  [address] Phone - [phone]   Fax - [phone]  [facility] [address] [place], [place]  [address] Phone - [phone]   Fax - [phone]  [facility] [address] [place], [place]  [address] Phone - [phone]   Fax - [phone]  [facility] [address] [place], [place]  [address] Phone - [phone]   Fax - [phone]  [facility]  [address] [place], [place]  [address] Phone - [phone]   Fax - [phone]  [facility] AT [facility] [address] [place], [place]  [address] Phone - [phone]   Fax - [phone]  [facility] AT [facility] [address] [place], [place]  [address] Phone - [phone]   Fax - [phone] [facility] [place] [address] [place], [place]  [address] Phone - [phone]   Fax - [phone]  [facility] [address] [place], [place]  [address] Phone - [phone]   Fax - [phone]  [facility] [address] [place], [place]  [address] Phone - [phone]   Fax - [phone]  [facility] [address] [place], [place]  [address] Phone - [phone]   Fax - [phone]  [facility] [address], [address] [place], [place]  [address] Phone - [phone]  [facility] [address] [place], [place]  [address] Phone -  [phone]   Fax - [phone]  [facility] [address] [place], [place]  [address] Phone - [phone]   Fax - [phone]  [facility] [address] [place], [place]  [address] Phone - [phone]   Fax - [phone]  [facility] [address] [place], [place]  [address] Phone - [phone]   Fax - [phone]   [facility] [address] [place], [place]  [address] Phone - [phone]   Fax - [phone]  [facility] [address] [place], [place]  [address] Phone - [phone]   Fax - [phone]  DAVID RUBIN [address] [place], [place]  [address] Phone - [phone]   Fax - [phone]  [facility] [address] [place], [place]  [address] Phone - [phone]   Fax - [phone]  [place] Alita Chyle [address] [place], [place]  [address] Phone - [phone]   Fax - [phone] Gerarda Fraction [address] [place], [place]  [address] Phone - [phone]   Fax - [phone]  [facility] [address] [place], [place]  [address] Phone - [phone]   Fax - [phone]  [facility] [place] [address] [place], [place]  [address] Phone - [phone]   Fax - [phone]

## 2015-03-28 ENCOUNTER — Encounter (HOSPITAL_COMMUNITY): Payer: Self-pay

## 2015-03-28 ENCOUNTER — Other Ambulatory Visit (HOSPITAL_COMMUNITY): Payer: Self-pay | Admitting: Obstetrics and Gynecology

## 2015-03-28 ENCOUNTER — Ambulatory Visit (HOSPITAL_COMMUNITY)
Admission: RE | Admit: 2015-03-28 | Discharge: 2015-03-28 | Disposition: A | Discharge status: Home / Self Care | Payer: Medicaid Other | Source: Ambulatory Visit | Attending: Obstetrics & Gynecology | Admitting: Obstetrics & Gynecology

## 2015-03-28 DIAGNOSIS — O24410 Gestational diabetes mellitus in pregnancy, diet controlled: Secondary | ICD-10-CM

## 2015-03-28 DIAGNOSIS — O365990 Maternal care for other known or suspected poor fetal growth, unspecified trimester, not applicable or unspecified: Secondary | ICD-10-CM

## 2015-03-28 DIAGNOSIS — O99213 Obesity complicating pregnancy, third trimester: Secondary | ICD-10-CM

## 2015-03-28 DIAGNOSIS — O365930 Maternal care for other known or suspected poor fetal growth, third trimester, not applicable or unspecified: Secondary | ICD-10-CM | POA: Insufficient documentation

## 2015-03-28 DIAGNOSIS — Z3A33 33 weeks gestation of pregnancy: Secondary | ICD-10-CM | POA: Insufficient documentation

## 2015-03-28 DIAGNOSIS — O24419 Gestational diabetes mellitus in pregnancy, unspecified control: Secondary | ICD-10-CM | POA: Insufficient documentation

## 2015-04-04 ENCOUNTER — Encounter (HOSPITAL_COMMUNITY): Payer: Self-pay

## 2015-04-04 ENCOUNTER — Ambulatory Visit (HOSPITAL_COMMUNITY)
Admission: RE | Admit: 2015-04-04 | Discharge: 2015-04-04 | Disposition: A | Discharge status: Home / Self Care | Payer: Medicaid Other | Source: Ambulatory Visit | Attending: Family Medicine | Admitting: Family Medicine

## 2015-04-04 ENCOUNTER — Other Ambulatory Visit (HOSPITAL_COMMUNITY): Payer: Self-pay | Admitting: Obstetrics and Gynecology

## 2015-04-04 DIAGNOSIS — O99213 Obesity complicating pregnancy, third trimester: Secondary | ICD-10-CM

## 2015-04-04 DIAGNOSIS — O24410 Gestational diabetes mellitus in pregnancy, diet controlled: Secondary | ICD-10-CM | POA: Diagnosis not present

## 2015-04-04 DIAGNOSIS — O365930 Maternal care for other known or suspected poor fetal growth, third trimester, not applicable or unspecified: Secondary | ICD-10-CM

## 2015-04-04 DIAGNOSIS — O365990 Maternal care for other known or suspected poor fetal growth, unspecified trimester, not applicable or unspecified: Secondary | ICD-10-CM

## 2015-04-04 DIAGNOSIS — Z3A34 34 weeks gestation of pregnancy: Secondary | ICD-10-CM | POA: Diagnosis not present

## 2015-04-05 ENCOUNTER — Other Ambulatory Visit (HOSPITAL_COMMUNITY): Payer: Self-pay | Admitting: *Deleted

## 2015-04-05 DIAGNOSIS — O365990 Maternal care for other known or suspected poor fetal growth, unspecified trimester, not applicable or unspecified: Secondary | ICD-10-CM

## 2015-04-09 ENCOUNTER — Encounter: Payer: Medicaid Other | Admitting: Obstetrics & Gynecology

## 2015-04-11 ENCOUNTER — Encounter (HOSPITAL_COMMUNITY): Payer: Self-pay

## 2015-04-11 ENCOUNTER — Ambulatory Visit (HOSPITAL_COMMUNITY)
Admission: RE | Admit: 2015-04-11 | Discharge: 2015-04-11 | Disposition: A | Discharge status: Home / Self Care | Payer: Medicaid Other | Source: Ambulatory Visit | Attending: Obstetrics and Gynecology | Admitting: Obstetrics and Gynecology

## 2015-04-11 ENCOUNTER — Other Ambulatory Visit (HOSPITAL_COMMUNITY): Payer: Self-pay | Admitting: Obstetrics and Gynecology

## 2015-04-11 DIAGNOSIS — O99213 Obesity complicating pregnancy, third trimester: Secondary | ICD-10-CM | POA: Insufficient documentation

## 2015-04-11 DIAGNOSIS — O24410 Gestational diabetes mellitus in pregnancy, diet controlled: Secondary | ICD-10-CM

## 2015-04-11 DIAGNOSIS — O365990 Maternal care for other known or suspected poor fetal growth, unspecified trimester, not applicable or unspecified: Secondary | ICD-10-CM

## 2015-04-11 DIAGNOSIS — Z3A35 35 weeks gestation of pregnancy: Secondary | ICD-10-CM | POA: Diagnosis not present

## 2015-04-11 DIAGNOSIS — O365930 Maternal care for other known or suspected poor fetal growth, third trimester, not applicable or unspecified: Secondary | ICD-10-CM | POA: Diagnosis present

## 2015-04-16 ENCOUNTER — Ambulatory Visit (INDEPENDENT_AMBULATORY_CARE_PROVIDER_SITE_OTHER): Payer: Medicaid Other | Admitting: Obstetrics and Gynecology

## 2015-04-16 ENCOUNTER — Other Ambulatory Visit (HOSPITAL_COMMUNITY)
Admission: RE | Admit: 2015-04-16 | Discharge: 2015-04-16 | Disposition: A | Discharge status: Home / Self Care | Payer: Medicaid Other | Source: Ambulatory Visit | Attending: Obstetrics and Gynecology | Admitting: Obstetrics and Gynecology

## 2015-04-16 VITALS — BP 129/72 | HR 91 | Temp 98.3°F | Wt 259.0 lb

## 2015-04-16 DIAGNOSIS — Z113 Encounter for screening for infections with a predominantly sexual mode of transmission: Secondary | ICD-10-CM | POA: Insufficient documentation

## 2015-04-16 DIAGNOSIS — O24410 Gestational diabetes mellitus in pregnancy, diet controlled: Secondary | ICD-10-CM | POA: Diagnosis not present

## 2015-04-16 DIAGNOSIS — O0993 Supervision of high risk pregnancy, unspecified, third trimester: Secondary | ICD-10-CM

## 2015-04-16 LAB — OB RESULTS CONSOLE GBS: GBS: POSITIVE

## 2015-04-16 LAB — POCT URINALYSIS DIP (DEVICE)
BILIRUBIN URINE: NEGATIVE
Glucose, UA: NEGATIVE mg/dL
KETONES UR: NEGATIVE mg/dL
Nitrite: NEGATIVE
PH: 6 (ref 5.0–8.0)
PROTEIN: NEGATIVE mg/dL
Specific Gravity, Urine: 1.015 (ref 1.005–1.030)
UROBILINOGEN UA: 0.2 mg/dL (ref 0.0–1.0)

## 2015-04-16 NOTE — Progress Notes (Signed)
36 wk labs today Educated pt on Rooming In

## 2015-04-16 NOTE — Progress Notes (Signed)
Subjective:  Anna Stout is a 25 y.o. G2P0010 at 7836w5d being seen today for ongoing prenatal care.  She is currently monitored for the following issues for this high-risk pregnancy and has Supervision of high-risk pregnancy; Gestational diabetes; ASCUS with positive high risk HPV; and Question of IUGR (intrauterine growth restriction) affecting care of mother on her problem list.  Patient reports no complaints.  Contractions: Not present. Vag. Bleeding: None.  Movement: Present. Denies leaking of fluid.  Vast majority of fastings less than 90 and 2-hour PPs less than 120  The following portions of the patient's history were reviewed and updated as appropriate: allergies, current medications, past family history, past medical history, past social history, past surgical history and problem list. Problem list updated.  Objective:   Filed Vitals:   04/16/15 1059  BP: 129/72  Pulse: 91  Temp: 98.3 F (36.8 C)  Weight: 259 lb (117.482 kg)    Fetal Status: Fetal Heart Rate (bpm): 151   Movement: Present     General:  Alert, oriented and cooperative. Patient is in no acute distress.  Skin: Skin is warm and dry. No rash noted.   Cardiovascular: Normal heart rate noted  Respiratory: Normal respiratory effort, no problems with respiration noted  Abdomen: Soft, gravid, appropriate for gestational age. Pain/Pressure: Present     Pelvic: Vag. Bleeding: None     Cervical exam deferred        Extremities: Normal range of motion.  Edema: None  Mental Status: Normal mood and affect. Normal behavior. Normal judgment and thought content.   Urinalysis: Urine Protein: Negative Urine Glucose: Negative  Assessment and Plan:  Pregnancy: G2P0010 at 2836w5d  1. Supervision of high-risk pregnancy, third trimester - Culture, beta strep (group b only) - GC[facility]Chlamydia probe amp ([place]  # A1GDM - glucose appropriate  # Suspected IUGR - continue weekly bpps, dopplers.  wnl last check  Preterm labor symptoms and general obstetric precautions including but not limited to vaginal bleeding, contractions, leaking of fluid and fetal movement were reviewed in detail with the patient. Please refer to After Visit Summary for other counseling recommendations.    Kathrynn RunningNoah [place] Dariana Garbett, MD

## 2015-04-17 ENCOUNTER — Encounter: Payer: Self-pay | Admitting: Obstetrics and Gynecology

## 2015-04-17 DIAGNOSIS — B951 Streptococcus, group B, as the cause of diseases classified elsewhere: Secondary | ICD-10-CM | POA: Insufficient documentation

## 2015-04-17 DIAGNOSIS — O98819 Other maternal infectious and parasitic diseases complicating pregnancy, unspecified trimester: Secondary | ICD-10-CM

## 2015-04-17 LAB — CULTURE, BETA STREP (GROUP B ONLY)

## 2015-04-17 LAB — GC/CHLAMYDIA PROBE AMP (~~LOC~~) NOT AT ARMC
CHLAMYDIA, DNA PROBE: NEGATIVE
Neisseria Gonorrhea: NEGATIVE

## 2015-04-18 ENCOUNTER — Encounter (HOSPITAL_COMMUNITY): Payer: Self-pay

## 2015-04-18 ENCOUNTER — Other Ambulatory Visit (HOSPITAL_COMMUNITY): Payer: Self-pay | Admitting: Maternal and Fetal Medicine

## 2015-04-18 ENCOUNTER — Ambulatory Visit (HOSPITAL_COMMUNITY)
Admission: RE | Admit: 2015-04-18 | Discharge: 2015-04-18 | Disposition: A | Discharge status: Home / Self Care | Payer: Medicaid Other | Source: Ambulatory Visit | Attending: Obstetrics & Gynecology | Admitting: Obstetrics & Gynecology

## 2015-04-18 VITALS — BP 110/74 | HR 97 | Wt 260.1 lb

## 2015-04-18 DIAGNOSIS — O365930 Maternal care for other known or suspected poor fetal growth, third trimester, not applicable or unspecified: Secondary | ICD-10-CM

## 2015-04-18 DIAGNOSIS — O365990 Maternal care for other known or suspected poor fetal growth, unspecified trimester, not applicable or unspecified: Secondary | ICD-10-CM

## 2015-04-18 DIAGNOSIS — Z3A36 36 weeks gestation of pregnancy: Secondary | ICD-10-CM

## 2015-04-18 DIAGNOSIS — O24410 Gestational diabetes mellitus in pregnancy, diet controlled: Secondary | ICD-10-CM

## 2015-04-18 DIAGNOSIS — O99213 Obesity complicating pregnancy, third trimester: Secondary | ICD-10-CM

## 2015-04-18 DIAGNOSIS — O0993 Supervision of high risk pregnancy, unspecified, third trimester: Secondary | ICD-10-CM

## 2015-04-25 ENCOUNTER — Encounter (HOSPITAL_COMMUNITY): Payer: Self-pay

## 2015-04-25 ENCOUNTER — Ambulatory Visit (HOSPITAL_COMMUNITY)
Admission: RE | Admit: 2015-04-25 | Discharge: 2015-04-25 | Disposition: A | Discharge status: Home / Self Care | Payer: Medicaid Other | Source: Ambulatory Visit | Attending: Obstetrics & Gynecology | Admitting: Obstetrics & Gynecology

## 2015-04-25 ENCOUNTER — Ambulatory Visit (INDEPENDENT_AMBULATORY_CARE_PROVIDER_SITE_OTHER): Payer: Medicaid Other | Admitting: Advanced Practice Midwife

## 2015-04-25 VITALS — BP 95/44 | HR 75 | Wt 259.0 lb

## 2015-04-25 DIAGNOSIS — O365930 Maternal care for other known or suspected poor fetal growth, third trimester, not applicable or unspecified: Secondary | ICD-10-CM | POA: Diagnosis not present

## 2015-04-25 DIAGNOSIS — O0993 Supervision of high risk pregnancy, unspecified, third trimester: Secondary | ICD-10-CM

## 2015-04-25 DIAGNOSIS — O365990 Maternal care for other known or suspected poor fetal growth, unspecified trimester, not applicable or unspecified: Secondary | ICD-10-CM

## 2015-04-25 DIAGNOSIS — Z3A37 37 weeks gestation of pregnancy: Secondary | ICD-10-CM | POA: Diagnosis not present

## 2015-04-25 DIAGNOSIS — O24410 Gestational diabetes mellitus in pregnancy, diet controlled: Secondary | ICD-10-CM | POA: Insufficient documentation

## 2015-04-25 DIAGNOSIS — O99213 Obesity complicating pregnancy, third trimester: Secondary | ICD-10-CM | POA: Insufficient documentation

## 2015-04-25 LAB — POCT URINALYSIS DIP (DEVICE)
Bilirubin Urine: NEGATIVE
GLUCOSE, UA: NEGATIVE mg/dL
KETONES UR: NEGATIVE mg/dL
Nitrite: NEGATIVE
Protein, ur: NEGATIVE mg/dL
SPECIFIC GRAVITY, URINE: 1.015 (ref 1.005–1.030)
UROBILINOGEN UA: 0.2 mg/dL (ref 0.0–1.0)
pH: 6 (ref 5.0–8.0)

## 2015-04-26 NOTE — Progress Notes (Signed)
Subjective:  Anna Stout is a 25 y.o. G2P0010 at 3069w1d being seen today for ongoing prenatal care.  She is currently monitored for the following issues for this high-risk pregnancy and has Supervision of high-risk pregnancy; Gestational diabetes; ASCUS with positive high risk HPV; Question of IUGR (intrauterine growth restriction) affecting care of mother; and Group B streptococcal infection during pregnancy on her problem list.  Patient reports no complaints.  Contractions: Not present.  .  Movement: Present. Denies leaking of fluid.   The following portions of the patient's history were reviewed and updated as appropriate: allergies, current medications, past family history, past medical history, past social history, past surgical history and problem list. Problem list updated.  Objective:   Filed Vitals:   04/25/15 1045  BP: 95/44  Pulse: 75  Weight: 259 lb (117.482 kg)    Fetal Status: Fetal Heart Rate (bpm): 130   Movement: Present     General:  Alert, oriented and cooperative. Patient is in no acute distress.  Skin: Skin is warm and dry. No rash noted.   Cardiovascular: Normal heart rate noted  Respiratory: Normal respiratory effort, no problems with respiration noted  Abdomen: Soft, gravid, appropriate for gestational age. Pain/Pressure: Absent     Pelvic:       Cervical exam deferred        Extremities: Normal range of motion.  Edema: None  Mental Status: Normal mood and affect. Normal behavior. Normal judgment and thought content.   Urinalysis:      Assessment and Plan:  Pregnancy: G2P0010 at 2469w1d  1. Supervision of high-risk pregnancy, third trimester   2. Diet controlled gestational diabetes mellitus in third trimester --Reviewed glucose log, fastings and PP wnl, none elevated.    3. IUGR (intrauterine growth restriction) affecting care of mother, third trimester, not applicable or unspecified fetus --No final report on today's US in MFM.   Called Dr Harlon FlorWhitaker to review delivery plan.  Per Dr Harlon FlorWhitaker, US with BPP 8/8, EFW 18%tile with high normal dopplers and no AEDF or REDF.  Plan for delivery at 39 weeks in absence of other factors.  Also, deceleration x1 occurred during US so Dr Harlon FlorWhitaker recommends NST in our office today.  RN to call pt to return to office today.    Term labor symptoms and general obstetric precautions including but not limited to vaginal bleeding, contractions, leaking of fluid and fetal movement were reviewed in detail with the patient. Please refer to After Visit Summary for other counseling recommendations.  No Follow-up on file.   Hurshel PartyLisa A Leftwich-Kirby, CNM

## 2015-05-02 ENCOUNTER — Encounter (HOSPITAL_COMMUNITY): Payer: Self-pay | Admitting: *Deleted

## 2015-05-02 ENCOUNTER — Ambulatory Visit (INDEPENDENT_AMBULATORY_CARE_PROVIDER_SITE_OTHER): Payer: Medicaid Other | Admitting: Advanced Practice Midwife

## 2015-05-02 ENCOUNTER — Encounter (HOSPITAL_COMMUNITY): Payer: Self-pay

## 2015-05-02 ENCOUNTER — Ambulatory Visit (HOSPITAL_COMMUNITY)
Admission: RE | Admit: 2015-05-02 | Discharge: 2015-05-02 | Disposition: A | Discharge status: Home / Self Care | Payer: Medicaid Other | Source: Ambulatory Visit | Attending: Obstetrics & Gynecology | Admitting: Obstetrics & Gynecology

## 2015-05-02 ENCOUNTER — Telehealth (HOSPITAL_COMMUNITY): Payer: Self-pay | Admitting: *Deleted

## 2015-05-02 VITALS — BP 116/62 | HR 79 | Wt 259.0 lb

## 2015-05-02 DIAGNOSIS — O365930 Maternal care for other known or suspected poor fetal growth, third trimester, not applicable or unspecified: Secondary | ICD-10-CM

## 2015-05-02 DIAGNOSIS — Z3A38 38 weeks gestation of pregnancy: Secondary | ICD-10-CM | POA: Diagnosis not present

## 2015-05-02 DIAGNOSIS — O24410 Gestational diabetes mellitus in pregnancy, diet controlled: Secondary | ICD-10-CM

## 2015-05-02 DIAGNOSIS — O99213 Obesity complicating pregnancy, third trimester: Secondary | ICD-10-CM | POA: Insufficient documentation

## 2015-05-02 DIAGNOSIS — O365990 Maternal care for other known or suspected poor fetal growth, unspecified trimester, not applicable or unspecified: Secondary | ICD-10-CM

## 2015-05-02 LAB — POCT URINALYSIS DIP (DEVICE)
Bilirubin Urine: NEGATIVE
Glucose, UA: NEGATIVE mg/dL
KETONES UR: NEGATIVE mg/dL
Nitrite: NEGATIVE
PROTEIN: NEGATIVE mg/dL
SPECIFIC GRAVITY, URINE: 1.015 (ref 1.005–1.030)
Urobilinogen, UA: 0.2 mg/dL (ref 0.0–1.0)
pH: 6.5 (ref 5.0–8.0)

## 2015-05-02 NOTE — Telephone Encounter (Signed)
Preadmission screen  

## 2015-05-04 NOTE — Progress Notes (Signed)
Subjective:  Anna Stout is a 25 y.o. G2P0010 at 4687w2d being seen today for ongoing prenatal care.  She is currently monitored for the following issues for this high-risk pregnancy and has Supervision of high-risk pregnancy; Gestational diabetes; ASCUS with positive high risk HPV; Question of IUGR (intrauterine growth restriction) affecting care of mother; and Group B streptococcal infection during pregnancy on her problem list.  Patient reports no complaints.  Contractions: Not present.  .  Movement: Present. Denies leaking of fluid.   The following portions of the patient's history were reviewed and updated as appropriate: allergies, current medications, past family history, past medical history, past social history, past surgical history and problem list. Problem list updated.  Objective:   Filed Vitals:   05/02/15 1054  BP: 116/62  Pulse: 79  Weight: 259 lb (117.482 kg)    Fetal Status: Fetal Heart Rate (bpm): 144   Movement: Present     General:  Alert, oriented and cooperative. Patient is in no acute distress.  Skin: Skin is warm and dry. No rash noted.   Cardiovascular: Normal heart rate noted  Respiratory: Normal respiratory effort, no problems with respiration noted  Abdomen: Soft, gravid, appropriate for gestational age. Pain/Pressure: Absent     Pelvic:       Cervical exam deferred        Extremities: Normal range of motion.  Edema: None  Mental Status: Normal mood and affect. Normal behavior. Normal judgment and thought content.   Urinalysis:      Assessment and Plan:  Pregnancy: G2P0010 at 10687w2d  1. Diet controlled gestational diabetes mellitus in third trimester --Reviewed glucose logs, Fastings and PP wnl  2. IUGR (intrauterine growth restriction) affecting care of mother, third trimester, not applicable or unspecified fetus --US on 4/26 with BPP 8/8 and elevated dopplers unchanged from previous study.  Recommend delivery at 39 weeks. IOL  scheduled for 5/3 in the PM.    Term labor symptoms and general obstetric precautions including but not limited to vaginal bleeding, contractions, leaking of fluid and fetal movement were reviewed in detail with the patient. Please refer to After Visit Summary for other counseling recommendations.  Return in about 1 week (around 05/09/2015).   Hurshel PartyLisa A Leftwich-Kirby, CNM

## 2015-05-07 ENCOUNTER — Encounter: Payer: Self-pay | Admitting: General Practice

## 2015-05-09 ENCOUNTER — Inpatient Hospital Stay (HOSPITAL_COMMUNITY)
Admission: RE | Admit: 2015-05-09 | Discharge: 2015-05-12 | DRG: 775 | Disposition: A | Discharge status: Home / Self Care | Payer: Medicaid Other | Source: Ambulatory Visit | Attending: Obstetrics & Gynecology | Admitting: Obstetrics & Gynecology

## 2015-05-09 ENCOUNTER — Encounter (HOSPITAL_COMMUNITY): Payer: Self-pay

## 2015-05-09 VITALS — BP 92/62 | HR 75 | Temp 97.7°F | Resp 18 | Ht 71.0 in | Wt 260.0 lb

## 2015-05-09 DIAGNOSIS — Z833 Family history of diabetes mellitus: Secondary | ICD-10-CM

## 2015-05-09 DIAGNOSIS — Z6836 Body mass index (BMI) 36.0-36.9, adult: Secondary | ICD-10-CM | POA: Diagnosis not present

## 2015-05-09 DIAGNOSIS — O0993 Supervision of high risk pregnancy, unspecified, third trimester: Secondary | ICD-10-CM

## 2015-05-09 DIAGNOSIS — O9952 Diseases of the respiratory system complicating childbirth: Secondary | ICD-10-CM | POA: Diagnosis present

## 2015-05-09 DIAGNOSIS — O24420 Gestational diabetes mellitus in childbirth, diet controlled: Principal | ICD-10-CM | POA: Diagnosis present

## 2015-05-09 DIAGNOSIS — O24419 Gestational diabetes mellitus in pregnancy, unspecified control: Secondary | ICD-10-CM | POA: Diagnosis present

## 2015-05-09 DIAGNOSIS — O99214 Obesity complicating childbirth: Secondary | ICD-10-CM | POA: Diagnosis present

## 2015-05-09 DIAGNOSIS — J45909 Unspecified asthma, uncomplicated: Secondary | ICD-10-CM | POA: Diagnosis present

## 2015-05-09 DIAGNOSIS — O365930 Maternal care for other known or suspected poor fetal growth, third trimester, not applicable or unspecified: Secondary | ICD-10-CM | POA: Diagnosis present

## 2015-05-09 DIAGNOSIS — E669 Obesity, unspecified: Secondary | ICD-10-CM | POA: Diagnosis present

## 2015-05-09 DIAGNOSIS — O99824 Streptococcus B carrier state complicating childbirth: Secondary | ICD-10-CM | POA: Diagnosis present

## 2015-05-09 DIAGNOSIS — Z3A39 39 weeks gestation of pregnancy: Secondary | ICD-10-CM | POA: Diagnosis not present

## 2015-05-09 DIAGNOSIS — O24429 Gestational diabetes mellitus in childbirth, unspecified control: Secondary | ICD-10-CM | POA: Diagnosis not present

## 2015-05-09 DIAGNOSIS — O24410 Gestational diabetes mellitus in pregnancy, diet controlled: Secondary | ICD-10-CM

## 2015-05-09 LAB — GLUCOSE, CAPILLARY
GLUCOSE-CAPILLARY: 109 mg/dL — AB (ref 65–99)
GLUCOSE-CAPILLARY: 130 mg/dL — AB (ref 65–99)
Glucose-Capillary: 68 mg/dL (ref 65–99)
Glucose-Capillary: 101 mg/dL — ABNORMAL HIGH (ref 65–99)

## 2015-05-09 LAB — CBC
HEMATOCRIT: 33.2 % — AB (ref 36.0–46.0)
HEMOGLOBIN: 11.6 g/dL — AB (ref 12.0–15.0)
MCH: 28.2 pg (ref 26.0–34.0)
MCHC: 34.9 g/dL (ref 30.0–36.0)
MCV: 80.6 fL (ref 78.0–100.0)
PLATELETS: 183 10*3/uL (ref 150–400)
RBC: 4.12 MIL/uL (ref 3.87–5.11)
RDW: 14 % (ref 11.5–15.5)
WBC: 11.4 10*3/uL — ABNORMAL HIGH (ref 4.0–10.5)

## 2015-05-09 LAB — TYPE AND SCREEN
ABO/RH(D): O POS
Antibody Screen: NEGATIVE

## 2015-05-09 LAB — ABO/RH: ABO/RH(D): O POS

## 2015-05-09 MED ORDER — OXYTOCIN BOLUS FROM INFUSION: 500.0000 mL | INTRAVENOUS | Status: DC

## 2015-05-09 MED ORDER — EPHEDRINE 5 MG/ML INJ
10.0000 mg | INTRAVENOUS | Status: DC | PRN
  Filled 2015-05-09: qty 2

## 2015-05-09 MED ORDER — DIPHENHYDRAMINE HCL 50 MG/ML IJ SOLN: 12.5000 mg | INTRAMUSCULAR | Status: DC | PRN

## 2015-05-09 MED ORDER — ACETAMINOPHEN 325 MG PO TABS: 650.0000 mg | ORAL_TABLET | ORAL | Status: DC | PRN

## 2015-05-09 MED ORDER — OXYTOCIN 10 UNIT/ML IJ SOLN
2.5000 [IU]/h | INTRAVENOUS | Status: DC
  Filled 2015-05-09: qty 4

## 2015-05-09 MED ORDER — LACTATED RINGERS IV SOLN: 500.0000 mL | INTRAVENOUS | Status: DC | PRN

## 2015-05-09 MED ORDER — PENICILLIN G POTASSIUM 5000000 UNITS IJ SOLR
2.5000 10*6.[IU] | INTRAVENOUS | Status: DC
  Administered 2015-05-09 – 2015-05-10 (×6): 2.5 10*6.[IU] via INTRAVENOUS
  Filled 2015-05-09 (×10): qty 2.5

## 2015-05-09 MED ORDER — FLEET ENEMA 7-19 GM/118ML RE ENEM
1.0000 | ENEMA | RECTAL | Status: DC | PRN
Start: 2015-05-09 — End: 2015-05-10

## 2015-05-09 MED ORDER — ONDANSETRON HCL 4 MG/2ML IJ SOLN
4.0000 mg | Freq: Four times a day (QID) | INTRAMUSCULAR | Status: DC | PRN
  Administered 2015-05-10: 4 mg via INTRAVENOUS
  Filled 2015-05-09: qty 2

## 2015-05-09 MED ORDER — LACTATED RINGERS IV SOLN: 500.0000 mL | Freq: Once | INTRAVENOUS | Status: DC

## 2015-05-09 MED ORDER — PENICILLIN G POTASSIUM 5000000 UNITS IJ SOLR
5.0000 10*6.[IU] | Freq: Once | INTRAVENOUS | Status: AC
  Administered 2015-05-09: 5 10*6.[IU] via INTRAVENOUS
  Filled 2015-05-09: qty 5

## 2015-05-09 MED ORDER — FENTANYL CITRATE (PF) 100 MCG/2ML IJ SOLN
100.0000 ug | INTRAMUSCULAR | Status: DC | PRN
  Administered 2015-05-09 – 2015-05-10 (×2): 100 ug via INTRAVENOUS
  Filled 2015-05-09 (×2): qty 2

## 2015-05-09 MED ORDER — MISOPROSTOL 25 MCG QUARTER TABLET
25.0000 ug | ORAL_TABLET | ORAL | Status: DC
  Administered 2015-05-09 (×4): 25 ug via VAGINAL
  Filled 2015-05-09 (×3): qty 1
  Filled 2015-05-09 (×2): qty 0.25
  Filled 2015-05-09: qty 1
  Filled 2015-05-09 (×2): qty 0.25
  Filled 2015-05-09: qty 1

## 2015-05-09 MED ORDER — PHENYLEPHRINE 40 MCG/ML (10ML) SYRINGE FOR IV PUSH (FOR BLOOD PRESSURE SUPPORT)
80.0000 ug | PREFILLED_SYRINGE | INTRAVENOUS | Status: DC | PRN
  Administered 2015-05-10 (×2): 80 ug via INTRAVENOUS
  Filled 2015-05-09: qty 5

## 2015-05-09 MED ORDER — LIDOCAINE HCL (PF) 1 % IJ SOLN
30.0000 mL | INTRAMUSCULAR | Status: DC | PRN
  Filled 2015-05-09: qty 30

## 2015-05-09 MED ORDER — OXYCODONE-ACETAMINOPHEN 5-325 MG PO TABS: 2.0000 | ORAL_TABLET | ORAL | Status: DC | PRN

## 2015-05-09 MED ORDER — FENTANYL 2.5 MCG/ML BUPIVACAINE 1/10 % EPIDURAL INFUSION (WH - ANES)
14.0000 mL/h | INTRAMUSCULAR | Status: DC | PRN
Start: 2015-05-09 — End: 2015-05-10
  Administered 2015-05-10: 15.5 mL/h via EPIDURAL
  Administered 2015-05-10 (×2): 14 mL/h via EPIDURAL
  Filled 2015-05-09 (×3): qty 125

## 2015-05-09 MED ORDER — PHENYLEPHRINE 40 MCG/ML (10ML) SYRINGE FOR IV PUSH (FOR BLOOD PRESSURE SUPPORT)
80.0000 ug | PREFILLED_SYRINGE | INTRAVENOUS | Status: DC | PRN
  Filled 2015-05-09: qty 10
  Filled 2015-05-09: qty 5
  Filled 2015-05-09: qty 10

## 2015-05-09 MED ORDER — OXYCODONE-ACETAMINOPHEN 5-325 MG PO TABS: 1.0000 | ORAL_TABLET | ORAL | Status: DC | PRN

## 2015-05-09 MED ORDER — CITRIC ACID-SODIUM CITRATE 334-500 MG/5ML PO SOLN: 30.0000 mL | ORAL | Status: DC | PRN

## 2015-05-09 MED ORDER — LACTATED RINGERS IV SOLN
INTRAVENOUS | Status: DC
  Administered 2015-05-09 – 2015-05-10 (×5): via INTRAVENOUS

## 2015-05-09 NOTE — H&P (Signed)
Anna Stout is an 25 y.o. female. HPI  LABOR AND DELIVERY ADMISSION HISTORY AND PHYSICAL NOTE  Anna Stout is a 25 y.o. female G2P0010 with IUP at 8690w0d presenting for IOL for A1DM and IUGR by 36wk US (EFW: 18%, AC: 6%, HC 3%).    She reports positive fetal movement. She denies leakage of fluid or vaginal bleeding. She denies headaches, blurred vision, RUQ/epigastric pain, SOB.  Prenatal History/Complications:  Past Medical History: Past Medical History  Diagnosis Date  . Asthma   . Pneumonia     Sept 2016  . Gestational diabetes     diet controlled    Past Surgical History: Past Surgical History  Procedure Laterality Date  . No past surgeries      Obstetrical History: OB History    Gravida Para Term Preterm AB TAB SAB Ectopic Multiple Living   2 0 0 0 1 0 1 0 0 0       Social History: Social History   Social History  . Marital Status: Single    Spouse Name: N/A  . Number of Children: N/A  . Years of Education: N/A   Social History Main Topics  . Smoking status: Never Smoker   . Smokeless tobacco: Never Used  . Alcohol Use: No  . Drug Use: Yes    Special: Marijuana     Comment: March 2017  . Sexual Activity: Yes   Other Topics Concern  . None   Social History Narrative    Family History: Family History  Problem Relation Age of Onset  . Diabetes Mother   . Diabetes Brother     Allergies: Allergies  Allergen Reactions  . Banana Itching and Rash    Prescriptions prior to admission  Medication Sig Dispense Refill Last Dose  . DiphenhydrAMINE HCl (BENADRYL ALLERGY PO) Take by mouth. Reported on 04/25/2015   Taking  . Prenatal Vit-Fe Fumarate-FA (MULTIVITAMIN-PRENATAL) 27-0.8 MG TABS tablet Take 1 tablet by mouth daily at 12 noon. 30 each 11 Taking     Review of Systems   All systems reviewed and negative except as stated in HPI Physical Exam Blood pressure 134/73, pulse 98, temperature 97.8 F (36.6 C),  temperature source Oral, resp. rate 18, height 5\' 11"  (1.803 m), weight 117.935 kg (260 lb), last menstrual period 08/09/2014. General appearance: alert and oriented; well-appearing and pleasant. Lungs: clear to auscultation bilaterally Heart: regular rate and rhythm Abdomen: soft, non-tender; bowel sounds normal Extremities: No calf swelling or tenderness Presentation: cephalic Fetal monitoring: 150/mod/+a/-d Uterine activity: No contractions     Prenatal labs: ABO, Rh: O/Positive/-- (11/23 0000) Antibody: Negative (11/23 0000) Rubella: !Error! RPR: NON REAC (02/06 1157)  HBsAg: Negative (11/23 0000)  HIV: NONREACTIVE (02/06 1157)  GBS: Positive (04/10 0000)  1 hr Glucola: 166 Genetic screening:  Quad neg Anatomy US: Normal  Prenatal Transfer Tool  Maternal Diabetes: Yes:  Diabetes Type:  Diet controlled Genetic Screening: Normal Quad neg Maternal Ultrasounds/Referrals: Abnormal:  Findings:   IUGR Fetal Ultrasounds or other Referrals:  None Maternal Substance Abuse:  No Significant Maternal Medications:  None Significant Maternal Lab Results: Lab values include: Group B Strep positive  No results found for this or any previous visit (from the past 24 hour(s)).  Patient Active Problem List   Diagnosis Date Noted  . Group B streptococcal infection during pregnancy 04/17/2015  . Question of IUGR (intrauterine growth restriction) affecting care of mother 03/26/2015  . ASCUS with positive high risk HPV 12/25/2014  . Supervision  of high-risk pregnancy 12/18/2014  . Gestational diabetes 12/18/2014    Assessment/Plan: [name] is a 25 y.o. G2P0010 at [redacted]w[redacted]d here for IOL for AL1DM and IUGR (EFW:18%, AC: 6%, HC 3%).  #Labor: IOL with cytotec, foley bulb and pitocin. #Pain: Plan for epidural #FWB: 150/mod/+a/-d #ID:  GBS+; receiving penicillin G #MOF: Breast #MOC:Undecided; considering IUD #Circ:  N/A  [name] 05/09/2015, 9:08 AM     Review  of Systems  Constitutional: Negative for fever.  Eyes: Negative for blurred vision.  Cardiovascular: Negative for chest pain.  Gastrointestinal: Negative.   Genitourinary: Negative.   Neurological: Negative for headaches.  Psychiatric/Behavioral: Negative.     Blood pressure 134/73, pulse 98, temperature 97.8 F (36.6 C), temperature source Oral, resp. rate 18, height  (1.803 m), weight 117.935 kg (260 lb), last menstrual period 08/09/2014. Physical Exam  Constitutional: She is oriented to person, place, and time. She appears well-developed and well-nourished. No distress.  HENT:  Head: Normocephalic and atraumatic.  Eyes: Conjunctivae are normal. Pupils are equal, round, and reactive to light.  Neck: Normal range of motion. Neck supple.  Cardiovascular: Normal rate, regular rhythm and normal heart sounds.   Respiratory: Effort normal and breath sounds normal.  GI: Soft. Bowel sounds are normal.  Genitourinary: Vagina normal and uterus normal.  Musculoskeletal: Normal range of motion.  Neurological: She is alert and oriented to person, place, and time.  Skin: Skin is warm and dry.  Psychiatric: She has a normal mood and affect. Her behavior is normal. Thought content normal.

## 2015-05-09 NOTE — Progress Notes (Signed)
LABOR PROGRESS NOTE  Anna Stout is a 25 y.o. G2P0010 at 4613w0d  admitted for IOL for A1GDM and IUGR.  Subjective: Patient is doing well and reports that she is just starting to feel pain and mild contractions every 4-5 minutes.   Objective: BP 112/67 mmHg  Pulse 79  Temp(Src) 98.1 F (36.7 C) (Oral)  Resp 18  Ht 5\' 11"  (1.803 m)  Wt 117.935 kg (260 lb)  BMI 36.28 kg/m2  LMP 08/09/2014 or  Filed Vitals:   05/09/15 0801 05/09/15 0805 05/09/15 1101 05/09/15 1359  BP: 134/73  112/47 112/67  Pulse: 98  85 79  Temp:    98.1 F (36.7 C)  TempSrc:    Oral  Resp: 18   18  Height:  5\' 11"  (1.803 m)    Weight:  117.935 kg (260 lb)      Dilation: 1.5 Effacement (%): 50 Cervical Position: Anterior Station: -2 Presentation: Vertex Exam by:: J.Cox, RN  Labs: Lab Results  Component Value Date   WBC 11.4* 05/09/2015   HGB 11.6* 05/09/2015   HCT 33.2* 05/09/2015   MCV 80.6 05/09/2015   PLT 183 05/09/2015    Patient Active Problem List   Diagnosis Date Noted  . Group B streptococcal infection during pregnancy 04/17/2015  . Question of IUGR (intrauterine growth restriction) affecting care of mother 03/26/2015  . ASCUS with positive high risk HPV 12/25/2014  . Supervision of high-risk pregnancy 12/18/2014  . Gestational diabetes 12/18/2014    Assessment / Plan: 25 y.o. G2P0010 at 5913w0d here for IOL for A1GDM and IUGR.  Labor: 1.5/50/-2, Patient was given 2nd dose of Cytotec at 1400, attempted Foley placement but unsuccessful. Will try again later. Fetal Wellbeing:  130/mod/+a/-d Pain Control:  Planning for epidural Anticipated MOD:  Vaginal  Jonni SangerSara Feldman, MS3 05/09/2015, 2:56 PM

## 2015-05-09 NOTE — H&P (Signed)
LABOR AND DELIVERY ADMISSION HISTORY AND PHYSICAL NOTE  Anna Stout is a 25 y.o. female G2P0010 with IUP at 3832w0d by LMP presenting for IOL for A1GDM.   She reports positive fetal movement. She denies leakage of fluid or vaginal bleeding.  She denies any headaches, blurred vision, RUQ/epigastric pain, SOB.  Prenatal History/Complications:  -A1GDM  - IUGR with EFW 18% at 36 week US; has been receiving biweekly BPP with NST  -ASCUS with positive high risk HPV  Past Medical History: Past Medical History  Diagnosis Date  . Asthma   . Pneumonia     Sept 2016  . Gestational diabetes     diet controlled    Past Surgical History: Past Surgical History  Procedure Laterality Date  . No past surgeries      Obstetrical History: OB History    Gravida Para Term Preterm AB TAB SAB Ectopic Multiple Living   2 0 0 0 1 0 1 0 0 0       Social History: Social History   Social History  . Marital Status: Single    Spouse Name: N/A  . Number of Children: N/A  . Years of Education: N/A   Social History Main Topics  . Smoking status: Never Smoker   . Smokeless tobacco: Never Used  . Alcohol Use: No  . Drug Use: Yes    Special: Marijuana     Comment: March 2017  . Sexual Activity: Yes   Other Topics Concern  . None   Social History Narrative    Family History: Family History  Problem Relation Age of Onset  . Diabetes Mother   . Diabetes Brother     Allergies: Allergies  Allergen Reactions  . Banana Itching and Rash    Prescriptions prior to admission  Medication Sig Dispense Refill Last Dose  . DiphenhydrAMINE HCl (BENADRYL ALLERGY PO) Take by mouth. Reported on 04/25/2015   Taking  . Prenatal Vit-Fe Fumarate-FA (MULTIVITAMIN-PRENATAL) 27-0.8 MG TABS tablet Take 1 tablet by mouth daily at 12 noon. 30 each 11 Taking     Review of Systems   All systems reviewed and negative except as stated in HPI  Blood pressure 134/73, pulse 98,  temperature 97.8 F (36.6 C), temperature source Oral, resp. rate 18, height 5\' 11"  (1.803 m), weight 260 lb (117.935 kg), last menstrual period 08/09/2014. General appearance: alert, cooperative and no distress Lungs: clear to auscultation bilaterally Heart: regular rate and rhythm Abdomen: soft, non-tender; bowel sounds normal Extremities: No calf swelling or tenderness Presentation: cephalic confirmed by US Fetal monitoring: 140bpm, moderate variability, accelerations present, no decelerations Uterine activity: no contractions Dilation: 1 Effacement (%): Thick Station: Ballotable Exam by:: Montez MoritaErin Hampton, RNC   Prenatal labs: ABO, Rh: O/Positive/-- (11/23 0000) Antibody: Negative (11/23 0000) Rubella: !Error! RPR: NON REAC (02/06 1157)  HBsAg: Negative (11/23 0000)  HIV: NONREACTIVE (02/06 1157)  GBS: Positive (04/10 0000)  1 hr Glucola: Failed- A1GDM Genetic screening: Quad negative Anatomy US: Normal  Prenatal Transfer Tool  Maternal Diabetes: Yes:  Diabetes Type:  Diet controlled Genetic Screening: Normal Maternal Ultrasounds/Referrals: Abnormal:  Findings:   IUGR Fetal Ultrasounds or other Referrals:  None Maternal Substance Abuse:  No Significant Maternal Medications:  None Significant Maternal Lab Results: Lab values include: Group B Strep positive  Results for orders placed or performed during the hospital encounter of 05/09/15 (from the past 24 hour(s))  CBC   Collection Time: 05/09/15  8:05 AM  Result Value Ref Range   WBC  11.4 (H) 4.0 - 10.5 K/uL   RBC 4.12 3.87 - 5.11 MIL/uL   Hemoglobin 11.6 (L) 12.0 - 15.0 g/dL   HCT 32.4 (L) 40.1 - 02.7 %   MCV 80.6 78.0 - 100.0 fL   MCH 28.2 26.0 - 34.0 pg   MCHC 34.9 30.0 - 36.0 g/dL   RDW 25.3 66.4 - 40.3 %   Platelets 183 150 - 400 K/uL  Glucose, capillary   Collection Time: 05/09/15  9:22 AM  Result Value Ref Range   Glucose-Capillary 109 (H) 65 - 99 mg/dL    Patient Active Problem List   Diagnosis Date  Noted  . Group B streptococcal infection during pregnancy 04/17/2015  . Question of IUGR (intrauterine growth restriction) affecting care of mother 03/26/2015  . ASCUS with positive high risk HPV 12/25/2014  . Supervision of high-risk pregnancy 12/18/2014  . Gestational diabetes 12/18/2014    Assessment: Anna Stout is a 25 y.o. G2P0010 at [redacted]w[redacted]d here for IOL for A1GDM  #Labor: IOL. Currently 1cm and thick. Will start with vaginal Cytotec. Plan for Foley later. #Pain: Well-controlled #FWB: Category I #ID: GBS pos- PCN #MOF: Breast #MOC: Undecided #Circ: n/a-girl  Katy D Mayo 05/09/2015, 10:15 AM   CNM attestation:  I have seen and examined this patient; I agree with above documentation in the Resident's note.    Clemmons,Lori Grissett, CNM 11:22 AM

## 2015-05-10 ENCOUNTER — Inpatient Hospital Stay (HOSPITAL_COMMUNITY): Payer: Medicaid Other | Admitting: Anesthesiology

## 2015-05-10 ENCOUNTER — Encounter (HOSPITAL_COMMUNITY): Payer: Self-pay

## 2015-05-10 DIAGNOSIS — O24429 Gestational diabetes mellitus in childbirth, unspecified control: Secondary | ICD-10-CM

## 2015-05-10 DIAGNOSIS — Z3A39 39 weeks gestation of pregnancy: Secondary | ICD-10-CM

## 2015-05-10 DIAGNOSIS — O99824 Streptococcus B carrier state complicating childbirth: Secondary | ICD-10-CM

## 2015-05-10 LAB — GLUCOSE, CAPILLARY
GLUCOSE-CAPILLARY: 89 mg/dL (ref 65–99)
GLUCOSE-CAPILLARY: 65 mg/dL (ref 65–99)
Glucose-Capillary: 75 mg/dL (ref 65–99)
Glucose-Capillary: 70 mg/dL (ref 65–99)

## 2015-05-10 LAB — RPR: RPR: NONREACTIVE

## 2015-05-10 MED ORDER — TETANUS-DIPHTH-ACELL PERTUSSIS 5-2.5-18.5 LF-MCG/0.5 IM SUSP: 0.5000 mL | Freq: Once | INTRAMUSCULAR | Status: DC

## 2015-05-10 MED ORDER — BENZOCAINE-MENTHOL 20-0.5 % EX AERO
1.0000 "application " | INHALATION_SPRAY | CUTANEOUS | Status: DC | PRN
  Filled 2015-05-10: qty 56

## 2015-05-10 MED ORDER — ONDANSETRON HCL 4 MG PO TABS: 4.0000 mg | ORAL_TABLET | ORAL | Status: DC | PRN

## 2015-05-10 MED ORDER — ACETAMINOPHEN 325 MG PO TABS: 650.0000 mg | ORAL_TABLET | ORAL | Status: DC | PRN

## 2015-05-10 MED ORDER — LIDOCAINE HCL (PF) 1 % IJ SOLN
INTRAMUSCULAR | Status: DC | PRN
  Administered 2015-05-10 (×2): 5 mL via EPIDURAL

## 2015-05-10 MED ORDER — WITCH HAZEL-GLYCERIN EX PADS: 1.0000 "application " | MEDICATED_PAD | CUTANEOUS | Status: DC | PRN

## 2015-05-10 MED ORDER — TERBUTALINE SULFATE 1 MG/ML IJ SOLN
0.2500 mg | Freq: Once | INTRAMUSCULAR | Status: DC | PRN
  Filled 2015-05-10: qty 1

## 2015-05-10 MED ORDER — DIBUCAINE 1 % RE OINT: 1.0000 "application " | TOPICAL_OINTMENT | RECTAL | Status: DC | PRN

## 2015-05-10 MED ORDER — SENNOSIDES-DOCUSATE SODIUM 8.6-50 MG PO TABS
2.0000 | ORAL_TABLET | ORAL | Status: DC
  Administered 2015-05-10 – 2015-05-11 (×2): 2 via ORAL
  Filled 2015-05-10 (×2): qty 2

## 2015-05-10 MED ORDER — DIPHENHYDRAMINE HCL 25 MG PO CAPS: 25.0000 mg | ORAL_CAPSULE | Freq: Four times a day (QID) | ORAL | Status: DC | PRN

## 2015-05-10 MED ORDER — COCONUT OIL OIL: 1.0000 "application " | TOPICAL_OIL | Status: DC | PRN

## 2015-05-10 MED ORDER — PRENATAL MULTIVITAMIN CH
1.0000 | ORAL_TABLET | Freq: Every day | ORAL | Status: DC
  Administered 2015-05-12: 1 via ORAL
  Filled 2015-05-10 (×2): qty 1

## 2015-05-10 MED ORDER — ZOLPIDEM TARTRATE 5 MG PO TABS: 5.0000 mg | ORAL_TABLET | Freq: Every evening | ORAL | Status: DC | PRN

## 2015-05-10 MED ORDER — IBUPROFEN 600 MG PO TABS
600.0000 mg | ORAL_TABLET | Freq: Four times a day (QID) | ORAL | Status: DC
  Administered 2015-05-10 – 2015-05-12 (×6): 600 mg via ORAL
  Filled 2015-05-10 (×7): qty 1

## 2015-05-10 MED ORDER — ONDANSETRON HCL 4 MG/2ML IJ SOLN: 4.0000 mg | INTRAMUSCULAR | Status: DC | PRN

## 2015-05-10 MED ORDER — OXYTOCIN 10 UNIT/ML IJ SOLN
1.0000 m[IU]/min | INTRAMUSCULAR | Status: DC
  Administered 2015-05-10: 2 m[IU]/min via INTRAVENOUS

## 2015-05-10 MED ORDER — LIDOCAINE-EPINEPHRINE (PF) 2 %-1:200000 IJ SOLN
INTRAMUSCULAR | Status: DC | PRN
  Administered 2015-05-10: 5 mL via EPIDURAL

## 2015-05-10 MED ORDER — SIMETHICONE 80 MG PO CHEW: 80.0000 mg | CHEWABLE_TABLET | ORAL | Status: DC | PRN

## 2015-05-10 NOTE — Lactation Note (Signed)
This note was copied from a baby's chart. Lactation Consultation Note  Patient Name: Anna Stout Today's Date: 05/10/2015 Reason for consult: Initial assessment Baby at 5 hr of life and MD was doing exam/talking to parents. Left handouts and instructions to call at next feeding.   Maternal Data    Feeding Feeding Type: Breast Fed Length of feed: 40 min  LATCH Score/Interventions Latch: Grasps breast easily, tongue down, lips flanged, rhythmical sucking.  Audible Swallowing: Spontaneous and intermittent  Type of Nipple: Everted at rest and after stimulation  Comfort (Breast/Nipple): Soft / non-tender     Hold (Positioning): Full assist, staff holds infant at breast Intervention(s): Breastfeeding basics reviewed;Support Pillows;Position options;Skin to skin  LATCH Score: 8  Lactation Tools Discussed/Used     Consult Status Consult Status: Follow-up Date: 05/10/15 Follow-up type: In-patient    [name] 05/10/2015, 10:59 PM

## 2015-05-10 NOTE — Progress Notes (Signed)
LABOR PROGRESS NOTE  Anna Stout is a 25 y.o. G2P0010 at 8158w1d  admitted for IOL for gDM and IUGR.  Subjective: Patient having some cramping from Cytotec, more comfortable after fentanyl.    Objective: BP 102/43 mmHg  Pulse 79  Temp(Src) 97.9 F (36.6 C) (Oral)  Resp 16  Ht 5\' 11"  (1.803 m)  Wt 260 lb (117.935 kg)  BMI 36.28 kg/m2  LMP 08/09/2014 or  Filed Vitals:   05/09/15 1359 05/09/15 1759 05/09/15 1800 05/09/15 2209  BP: 112/67  131/72 102/43  Pulse: 79  83 79  Temp: 98.1 F (36.7 C) 98.2 F (36.8 C)  97.9 F (36.6 C)  TempSrc: Oral Oral  Oral  Resp: 18 20  16   Height:      Weight:       Gen: alert, in NAD Chest: normal WOB Psych: cooperative, appropriate SVE: 3/50/-3  Labs: Lab Results  Component Value Date   WBC 11.4* 05/09/2015   HGB 11.6* 05/09/2015   HCT 33.2* 05/09/2015   MCV 80.6 05/09/2015   PLT 183 05/09/2015    Patient Active Problem List   Diagnosis Date Noted  . Group B streptococcal infection during pregnancy 04/17/2015  . Question of IUGR (intrauterine growth restriction) affecting care of mother 03/26/2015  . ASCUS with positive high risk HPV 12/25/2014  . Supervision of high-risk pregnancy 12/18/2014  . Gestational diabetes 12/18/2014    Assessment / Plan: 25 y.o. G2P0010 at 358w1d here for IOL for gDM and IUGR.  Labor: SVE now 3/50/-3 with Cytotec.  Foley balloon placed at 00:10. Fetal Wellbeing:  Category 1 tracing Pain Control:  IV pain meds prn Anticipated MOD:  Anticipate SVD  Caesar ChestnutKelly E Naje Rice, MD 05/10/2015, 12:13 AM

## 2015-05-10 NOTE — Anesthesia Procedure Notes (Addendum)
Epidural Patient location during procedure: OB Start time: 05/10/2015 2:46 AM  Staffing Anesthesiologist: Mal AmabileFOSTER, MICHAEL Performed by: anesthesiologist   Preanesthetic Checklist Completed: patient identified, site marked, surgical consent, pre-op evaluation, timeout performed, IV checked, risks and benefits discussed and monitors and equipment checked  Epidural Patient position: sitting Prep: site prepped and draped and DuraPrep Patient monitoring: continuous pulse ox and blood pressure Approach: midline Location: L3-L4 Injection technique: LOR air  Needle:  Needle type: Tuohy  Needle gauge: 17 G Needle length: 9 cm and 9 Needle insertion depth: 8 cm Catheter type: closed end flexible Catheter size: 19 Gauge Catheter at skin depth: 10 cm Test dose: negative and Other  Assessment Events: blood not aspirated, injection not painful, no injection resistance, negative IV test and no paresthesia  Additional Notes Patient identified. Risks and benefits discussed including failed block, incomplete  Pain control, post dural puncture headache, nerve damage, paralysis, blood pressure Changes, nausea, vomiting, reactions to medications-both toxic and allergic and post Partum back pain. All questions were answered. Patient expressed understanding and wished to proceed. Sterile technique was used throughout procedure. Epidural site was Dressed with sterile barrier dressing. No paresthesias, signs of intravascular injection Or signs of intrathecal spread were encountered.  Patient was more comfortable after the epidural was dosed. Please see RN's note for documentation of vital signs and FHR which are stable.   Epidural Patient location during procedure: OB  Staffing Anesthesiologist: Sidney Kann Performed by: anesthesiologist   Preanesthetic Checklist Completed: patient identified, site marked, surgical consent, pre-op evaluation, timeout performed, IV checked, risks and benefits  discussed and monitors and equipment checked  Epidural Patient position: sitting Prep: site prepped and draped and DuraPrep Patient monitoring: continuous pulse ox and blood pressure Approach: midline Location: L3-L4 Injection technique: LOR saline  Needle:  Needle type: Tuohy  Needle gauge: 17 G Needle length: 9 cm and 9 Needle insertion depth: 6 cm Catheter type: closed end flexible Catheter size: 19 Gauge Catheter at skin depth: 10 cm Test dose: negative  Assessment Events: blood not aspirated, injection not painful, no injection resistance, negative IV test and no paresthesia  Additional Notes Patient identified. Risks/Benefits/Options discussed with patient including but not limited to bleeding, infection, nerve damage, paralysis, failed block, incomplete pain control, headache, blood pressure changes, nausea, vomiting, reactions to medication both or allergic, itching and postpartum back pain. Confirmed with bedside nurse the patient's most recent platelet count. Confirmed with patient that they are not currently taking any anticoagulation, have any bleeding history or any family history of bleeding disorders. Patient expressed understanding and wished to proceed. All questions were answered. Sterile technique was used throughout the entire procedure. Please see nursing notes for vital signs. Test dose was given through epidural catheter and negative prior to continuing to dose epidural or start infusion. Warning signs of high block given to the patient including shortness of breath, tingling/numbness in hands, complete motor block, or any concerning symptoms with instructions to call for help. Patient was given instructions on fall risk and not to get out of bed. All questions and concerns addressed with instructions to call with any issues or inadequate analgesia.

## 2015-05-10 NOTE — Anesthesia Pain Management Evaluation Note (Signed)
  CRNA Pain Management Visit Note  Patient: Anna Stout, [age]Mar[age] y.o., female  "Hello I am a member of the anesthesia team at [facility]. We have an anesthesia team available at all times to provide care throughout the hospital, including epidural management and anesthesia for C-section. I don't know your plan for the delivery whether it a natural birth, water birth, IV sedation, nitrous supplementation, doula or epidural, but we want to meet your pain goals."   1.Was your pain managed to your expectations on prior hospitalizations?   Yes   2.What is your expectation for pain management during this hospitalization?     Epidural  3.How can we help you reach that goal? Via epidural  Record the patient's initial score and the patient's pain goal.   Pain: 0  Pain Goal: 2 [facility] wants you to be able to say your pain was always managed very well.  [name] 05/10/2015

## 2015-05-10 NOTE — Progress Notes (Signed)
LABOR PROGRESS NOTE  Anna Stout is a 25 y.o. G2P0010 at 3564w1d  admitted for IOL for A1gDM and IUGR.  Subjective: Patient has been on pitocin.  Comfortable now with epidural.  Objective: BP 99/65 mmHg  Pulse 75  Temp(Src) 98.1 F (36.7 C) (Oral)  Resp 16  Ht 5\' 11"  (1.803 m)  Wt 260 lb (117.935 kg)  BMI 36.28 kg/m2  SpO2 99%  LMP 08/09/2014 or  Filed Vitals:   05/10/15 0430 05/10/15 0500 05/10/15 0530 05/10/15 0600  BP: 106/54 107/68 100/60 99/65  Pulse: 75 79 73 75  Temp:      TempSrc:      Resp:      Height:      Weight:      SpO2:        Dilation: 5 Effacement (%): 60 Cervical Position: Middle Station: -1 Presentation: Vertex Exam by:: Lanice ShirtsV Rogers RN   Labs: Lab Results  Component Value Date   WBC 11.4* 05/09/2015   HGB 11.6* 05/09/2015   HCT 33.2* 05/09/2015   MCV 80.6 05/09/2015   PLT 183 05/09/2015    Patient Active Problem List   Diagnosis Date Noted  . Group B streptococcal infection during pregnancy 04/17/2015  . Question of IUGR (intrauterine growth restriction) affecting care of mother 03/26/2015  . ASCUS with positive high risk HPV 12/25/2014  . Supervision of high-risk pregnancy 12/18/2014  . Gestational diabetes 12/18/2014    Assessment / Plan: 25 y.o. G2P0010 at 7164w1d here for IOL for A1gDM and IUGR.  Labor: Last SVE 5/60/-1.  Continue pitocin.  Consider AROM. Fetal Wellbeing:  Category 1 tracing Pain Control:  Epidural Anticipated MOD:  Anticipate SVD  Caesar ChestnutKelly E Jamani Eley, MD 05/10/2015, 6:34 AM

## 2015-05-10 NOTE — Progress Notes (Signed)
LABOR PROGRESS NOTE  Anna Stout is a 25 y.o. G2P0010 at 2732w1d  admitted for IOL for A1DM and IUGR.  Subjective: Doing well, pain well-controlled after epidural  Objective: BP 139/90 mmHg  Pulse 74  Temp(Src) 97.8 F (36.6 C) (Oral)  Resp 16  Ht 5\' 11"  (1.803 m)  Wt 260 lb (117.935 kg)  BMI 36.28 kg/m2  SpO2 99%  LMP 08/09/2014 or  Filed Vitals:   05/10/15 0930 05/10/15 1000 05/10/15 1030 05/10/15 1101  BP: 133/73 122/66 124/70 139/90  Pulse: 71 71 75 74  Temp:      TempSrc:      Resp:      Height:      Weight:      SpO2:        Dilation: 5 Effacement (%): 60 Cervical Position: Middle Station: -2 Presentation: Vertex Exam by:: J.Cox, RN  Labs: Lab Results  Component Value Date   WBC 11.4* 05/09/2015   HGB 11.6* 05/09/2015   HCT 33.2* 05/09/2015   MCV 80.6 05/09/2015   PLT 183 05/09/2015    Patient Active Problem List   Diagnosis Date Noted  . Group B streptococcal infection during pregnancy 04/17/2015  . Question of IUGR (intrauterine growth restriction) affecting care of mother 03/26/2015  . ASCUS with positive high risk HPV 12/25/2014  . Supervision of high-risk pregnancy 12/18/2014  . Gestational diabetes 12/18/2014    Assessment / Plan: 25 y.o. G2P0010 at 2632w1d here for IOL for A1DM and IUGR  Labor: S/p Cytotec, Foley. Now on Pitocin. AROM at 1100 (clear). IUPC placed. Fetal Wellbeing:  Category I Pain Control:  Well-controlled with epidural Anticipated MOD:  SVD  Hilton SinclairKaty D Jahmia Berrett, MD 05/10/2015, 11:04 AM

## 2015-05-10 NOTE — Progress Notes (Signed)
LABOR PROGRESS NOTE  Mariel Sleetlexandria D Rodriquez-McKinney is a 25 y.o. G2P0010 at 6020w1d  admitted for IOL for A1DM and IUGR.  Subjective: Doing well, pain is well-controlled with new epidural that was placed.  Patient is complete and feeling a lot of pressure, but does not yet have the urge to push and is laboring down at this time.  Objective: BP 100/79 mmHg  Pulse 104  Temp(Src) 98.2 F (36.8 C) (Oral)  Resp 16  Ht 5\' 11"  (1.803 m)  Wt 117.935 kg (260 lb)  BMI 36.28 kg/m2  SpO2 100%  LMP 08/09/2014 or  Filed Vitals:   05/10/15 1500 05/10/15 1530 05/10/15 1600 05/10/15 1602  BP: 140/92 144/71 100/79   Pulse: 104 94 104   Temp:    98.2 F (36.8 C)  TempSrc:    Oral  Resp:      Height:      Weight:      SpO2:         Dilation: 10 Dilation Complete Date: 05/10/15 Dilation Complete Time: 1602 Effacement (%): 100 Cervical Position: Middle Station: +1 Presentation: Vertex Exam by:: J.Cox, RN  Labs: Lab Results  Component Value Date   WBC 11.4* 05/09/2015   HGB 11.6* 05/09/2015   HCT 33.2* 05/09/2015   MCV 80.6 05/09/2015   PLT 183 05/09/2015    Patient Active Problem List   Diagnosis Date Noted  . Group B streptococcal infection during pregnancy 04/17/2015  . Question of IUGR (intrauterine growth restriction) affecting care of mother 03/26/2015  . ASCUS with positive high risk HPV 12/25/2014  . Supervision of high-risk pregnancy 12/18/2014  . Gestational diabetes 12/18/2014    Assessment / Plan: 25 y.o. G2P0010 at 4120w1d here for IOL for A1DM and IUGR.  Labor: 10/100/+1; epidural was replaced and patient is laboring down at this time Fetal Wellbeing:  130/mod/+a/-d Pain Control:  Well-controlled with epidural Anticipated MOD:  SVD  Jonni SangerSara Jaxxon Naeem, MS3 05/10/2015, 4:23 PM

## 2015-05-10 NOTE — Anesthesia Preprocedure Evaluation (Signed)
Anesthesia Evaluation  Patient identified by MRN, date of birth, ID band Patient awake    Airway Mallampati: III  TM Distance: >3 FB Neck ROM: Full    Dental no notable dental hx. (+) Teeth Intact   Pulmonary asthma , pneumonia, resolved,    Pulmonary exam normal breath sounds clear to auscultation       Cardiovascular negative cardio ROS Normal cardiovascular exam Rhythm:Regular Rate:Normal     Neuro/Psych negative neurological ROS  negative psych ROS   GI/Hepatic negative GI ROS, Neg liver ROS,   Endo/Other  diabetes, Well Controlled, GestationalObesity  Renal/GU negative Renal ROS  negative genitourinary   Musculoskeletal negative musculoskeletal ROS (+)   Abdominal (+) + obese,   Peds  Hematology  (+) anemia ,   Anesthesia Other Findings   Reproductive/Obstetrics (+) Pregnancy                             Anesthesia Physical Anesthesia Plan  ASA: II  Anesthesia Plan: Epidural   Post-op Pain Management:    Induction:   Airway Management Planned: Natural Airway  Additional Equipment:   Intra-op Plan:   Post-operative Plan:   Informed Consent: I have reviewed the patients History and Physical, chart, labs and discussed the procedure including the risks, benefits and alternatives for the proposed anesthesia with the patient or authorized representative who has indicated his/her understanding and acceptance.     Plan Discussed with: Anesthesiologist  Anesthesia Plan Comments:         Anesthesia Quick Evaluation

## 2015-05-11 NOTE — Anesthesia Post-op Follow-up Note (Cosign Needed)
  Anesthesia Pain Follow-up Note  Patient: Anna Stout  Day #: 1  Date of Follow-up: 05/11/2015 Time: 8:58 AM  Last Vitals:  Filed Vitals:   05/11/15 0551 05/11/15 0603  BP: 76/47 101/48  Pulse: 75 80  Temp: 36.7 C   Resp: 18     Level of Consciousness: alert  Pain: none   Side Effects:None  Catheter Site Exam:healing  Plan: D/C from anesthesia care  Sulema Braid

## 2015-05-11 NOTE — Progress Notes (Signed)
UR chart review completed.  

## 2015-05-11 NOTE — Anesthesia Postprocedure Evaluation (Signed)
Anesthesia Post Note  Patient: Anna Stout  Procedure(s) Performed: * No procedures listed *  Patient location during evaluation: Mother Baby Anesthesia Type: Epidural Level of consciousness: awake and alert Pain management: satisfactory to patient Vital Signs Assessment: post-procedure vital signs reviewed and stable Respiratory status: respiratory function stable Cardiovascular status: stable Postop Assessment: no headache, no backache, epidural receding, patient able to bend at knees, no signs of nausea or vomiting and adequate PO intake Anesthetic complications: no Comments: Comfort level was assessed by AnesthesiaTeam and the patient was pleased with the care, interventions, and services provided by the Department of Anesthesia.     Last Vitals:  Filed Vitals:   05/11/15 0551 05/11/15 0603  BP: 76/47 101/48  Pulse: 75 80  Temp: 36.7 C   Resp: 18     Last Pain:  Filed Vitals:   05/11/15 0603  PainSc: 0-No pain   Pain Goal: Patients Stated Pain Goal: 2 (05/10/15 0230)               Karleen DolphinFUSSELL,Marque Bango

## 2015-05-11 NOTE — Progress Notes (Signed)
Post Partum Day 1 Subjective: no complaints, up ad lib, voiding and tolerating PO, small lochia, plans to breastfeed, undecided about BC options, discussed.  Leaning toward Mirena Objective: Blood pressure 101/48, pulse 80, temperature 98 F (36.7 C), temperature source Oral, resp. rate 18, height 5\' 11"  (1.803 m), weight 117.935 kg (260 lb), last menstrual period 08/09/2014, SpO2 100 %, unknown if currently breastfeeding.  Physical Exam:  General: alert, cooperative and no distress Lochia:normal flow Chest: CTAB Heart: RRR no m/r/g Abdomen: +BS, soft, nontender,  Uterine Fundus: firm DVT Evaluation: No evidence of DVT seen on physical exam. Extremities: trace edema   Recent Labs  05/09/15 0805  HGB 11.6*  HCT 33.2*    Assessment/Plan: Plan for discharge tomorrow, Breastfeeding and Lactation consult   LOS: 2 days   CRESENZO-DISHMAN,Shaquille Murdy 05/11/2015, 7:25 AM

## 2015-05-11 NOTE — Lactation Note (Signed)
This note was copied from a baby's chart. Lactation Consultation Note New mom needing a lot of assistance w/positioning w/BF. Mom has large pendulum soft breast w/everted nipples. Needed full assistance w/latching. Baby eager to BF. Positioned in football  And taught "C" hold hand expression taught w/colostrum easily noted. Needed a lot of positional instruction in latching. Baby latched off and on several times w/lipstick shaped nipple. Encouraged mom to obtain deeper latch. Mom encouraged to feed baby 8-12 times/24 hours and with feeding cues. Educated about newborn behavior, feeding patterns and rest periods, STS, I&O, cluster feeding, supply and demand. Referred to Baby and Me Book in Breastfeeding section Pg. 22-23 for position options and Proper latch demonstration.[facility] brochure given w/resources, support groups and LC services.    Patient Name: Anna Stout Today's Date: 05/11/2015 Reason for consult: Initial assessment   Maternal Data Has patient been taught Hand Expression?: Yes Does the patient have breastfeeding experience prior to this delivery?: No  Feeding Feeding Type: Breast Fed Length of feed: 10 min (still BF)  LATCH Score/Interventions Latch: Repeated attempts needed to sustain latch, nipple held in mouth throughout feeding, stimulation needed to elicit sucking reflex. Intervention(s): Adjust position;Assist with latch;Breast massage;Breast compression  Audible Swallowing: A few with stimulation Intervention(s): Skin to skin;Hand expression;Alternate breast massage  Type of Nipple: Everted at rest and after stimulation  Comfort (Breast/Nipple): Soft / non-tender     Hold (Positioning): Full assist, staff holds infant at breast Intervention(s): Breastfeeding basics reviewed;Support Pillows;Position options;Skin to skin  LATCH Score: 6  Lactation Tools Discussed/Used     Consult Status Consult Status: Follow-up Date: 05/11/15 Follow-up  type: In-patient    Charyl DancerCARVER, Porchia Sinkler G 05/11/2015, 4:58 AM

## 2015-05-11 NOTE — Progress Notes (Signed)
Post Partum Day 1 Subjective:  Anna Stout is a 25 y.o. G2P1011 8948w1d s/p NSVD.  No acute events overnight.  Pt denies problems with ambulating, voiding or po intake.  She denies nausea or vomiting.  Pain is well controlled.  She has not had flatus. She has not had bowel movement.  Lochia Small.  Plan for birth control is IUD.  Method of Feeding: breast.  Objective: Blood pressure 101/48, pulse 80, temperature 98 F (36.7 C), temperature source Oral, resp. rate 18, height 5\' 11"  (1.803 m), weight 117.935 kg (260 lb), last menstrual period 08/09/2014, SpO2 100 %, unknown if currently breastfeeding.  Physical Exam:  General: alert, cooperative and no distress Lochia:normal flow Chest: CTAB Heart: RRR no m/r/g Abdomen: +BS, soft, nontender Uterine Fundus: firm, below level of umbilicus DVT Evaluation: No evidence of DVT seen on physical exam. Extremities: No edema   Recent Labs  05/09/15 0805  HGB 11.6*  HCT 33.2*    Assessment/Plan:  ASSESSMENT: Anna Stout is a 25 y.o. G2P1011 2548w1d s/p NSVD.  She is doing well and feels ready to go home tomorrow morning.  Plan for discharge tomorrow   LOS: 2 days   Jonni SangerSara Feldman 05/11/2015, 7:47 AM     I have seen and examined this patient and agree the above assessment.  Respiratory effort normal, lochia appropriate, legs negative,  pain level normal. Separate note written in epic CRESENZO-DISHMAN,[name] 05/16/2015 12:04 PM

## 2015-05-11 NOTE — Lactation Note (Signed)
This note was copied from a baby's chart. Lactation Consultation Note  Patient Name: [name] Today's Date: 05/11/2015 Reason for consult: Follow-up assessment   Follow up with mom. Mom reports infant fed from 1400-1435 and did well. She denies nipple pain. Infant currently asleep STS on mom's chest. Mom without questions/concerns at this time. Enc her to call with questions/concerns/feeding assistance. Follow up tomorrow.    Maternal Data Formula Feeding for Exclusion: No  Feeding Feeding Type: Breast Fed Length of feed:  (few sucks infant sleepy)  LATCH Score/Interventions Latch: Grasps breast easily, tongue down, lips flanged, rhythmical sucking. Intervention(s): Assist with latch  Audible Swallowing: A few with stimulation Intervention(s): Hand expression  Type of Nipple: Everted at rest and after stimulation  Comfort (Breast/Nipple): Soft / non-tender     Hold (Positioning): Assistance needed to correctly position infant at breast and maintain latch.  LATCH Score: 8  Lactation Tools Discussed/Used     Consult Status Consult Status: Follow-up Date: 05/12/15 Follow-up type: In-patient    Silas FloodSharon S Soni Kegel 05/11/2015, 2:55 PM

## 2015-05-12 ENCOUNTER — Ambulatory Visit: Payer: Self-pay

## 2015-05-12 MED ORDER — ACETAMINOPHEN 325 MG PO TABS: 650.0000 mg | ORAL_TABLET | ORAL | Status: AC | PRN

## 2015-05-12 MED ORDER — IBUPROFEN 600 MG PO TABS: 600.0000 mg | ORAL_TABLET | Freq: Four times a day (QID) | ORAL | Status: AC

## 2015-05-12 NOTE — Clinical Social Work Maternal (Signed)
  CLINICAL SOCIAL WORK MATERNAL/CHILD NOTE  Patient Details  Name: Anna Stout MRN: [id number] Date of Birth: 09/03/1990  Date:  05/12/2015  Clinical Social Worker Initiating Note:  Doreen SalvageGrier Kaion [place], [place] Date/ Time Initiated:  05/12/15/1038     Child's Name:  Anna Stout   Legal Guardian:  Mother   Need for Interpreter:  None   Date of Referral:  05/10/15     Reason for Referral:  Current Substance Use/Substance Use During Pregnancy  Early use prior to aware of pregnancy, no use since.   Referral Source:  Physician   Address:  [address] [place], [place]  Phone number:  [phone]   Household Members:  Significant Other/FOB Kemper DurieElliot Stout  Natural Supports (not living in the home):  Community, Extended Family, Spouse/significant other   Professional Supports: None noted, [facility]  Employment: Full-time   Type of Work: Architectural technologistsociology   Education:  IT sales professionalCollege graduate   Financial Resources:  [employer]icaid   Other Resources:      Cultural/Religious Considerations Which May Impact Care:  none noted   Strengths:  Ability to meet basic needs , Compliance with medical plan , Home prepared for child , Other (Comment), Pediatrician chosen    Risk Factors/Current Problems:  None   Cognitive State:  Insightful , Able to Concentrate , Goal Oriented , Linear Thinking    Mood/Affect:  Bright , Happy , Comfortable    CSW Assessment: MOB very open to meeting with CSW. She was made aware of UDS being negative and need for UDS [facility]. MOB denies other drug use during pregnancy and was on the phone with ped office working on making am appt for follow up. MOB appears to be bonding well and reports to have a great support system.   CSW Plan/Description:  No Further Intervention Required/No Barriers to Discharge, Patient/Family Education     Vennie HomansHock, Kayra Crowell [place], [place] 05/12/2015, 10:42 AM

## 2015-05-12 NOTE — Lactation Note (Signed)
This note was copied from a baby's chart. Lactation Consultation Note  Baby is not transferring well.  SHe attaches to the breast which is more than she was doing before. Re-weighed her and she has lost more weight. Plan is to initiate supplementation at the breast in the form of an SNS.  If she is not able to transfer with the 655fr feeding tube and 12 ml syringe she will be finger fed.  Mom desires to use expressed BM first and to add formula if needed.  Patient Name: Anna Stout Today's Date: 05/12/2015 Reason for consult: Follow-up assessment   Maternal Data    Feeding Feeding Type: Breast Fed Length of feed:  (not transferring)  LATCH Score/Interventions Latch: Repeated attempts needed to sustain latch, nipple held in mouth throughout feeding, stimulation needed to elicit sucking reflex.  Audible Swallowing: None  Type of Nipple: Everted at rest and after stimulation  Comfort (Breast/Nipple): Soft / non-tender     Hold (Positioning): Assistance needed to correctly position infant at breast and maintain latch.  LATCH Score: 6  Lactation Tools Discussed/Used     Consult Status Consult Status: Follow-up Date: 05/13/15 Follow-up type: In-patient    Soyla DryerJoseph, Lenia Housley 05/12/2015, 4:25 PM

## 2015-05-12 NOTE — Lactation Note (Signed)
This note was copied from a baby's chart. Lactation Consultation Note  Mom's perception is that BF is going well. However, observation of BF [facility] reveals otherwise. Anna Stout is tongue thrusting and only holds the nipple in her mouth. She takes 2-3 sucks and falls asleep. Spoon feeding was done and she mobilized her tongue better. She was placed on the left breast in a laid back position and there was a bit more activity from her. Anna Stout did not pull a gloved finger deeply into her mouth. It is noted that her tongue does not elevate when she cries and the center of it retracts a bit with extension. Plan for now is to BF and follow with hand expression and cup or spoon feeding.  Patient Name: Anna Stout Today's Date: 05/12/2015 Reason for consult: Follow-up assessment   Maternal Data    Feeding Feeding Type: Breast Fed Length of feed: 10 min  LATCH Score/Interventions Latch: Repeated attempts needed to sustain latch, nipple held in mouth throughout feeding, stimulation needed to elicit sucking reflex.  Audible Swallowing: A few with stimulation  Type of Nipple: Everted at rest and after stimulation  Comfort (Breast/Nipple): Soft / non-tender     Hold (Positioning): Assistance needed to correctly position infant at breast and maintain latch.  LATCH Score: 7  Lactation Tools Discussed/Used     Consult Status Consult Status: Follow-up Date: 05/12/15 Follow-up type: In-patient    Anna Stout, Anna Stout 05/12/2015, 10:26 AM

## 2015-05-12 NOTE — Discharge Summary (Signed)
OB Discharge Summary     Patient Name: Anna Stout DOB: Feb 11, 1990 MRN: [id number]  Date of admission: 05/09/2015 Delivering MD: [name] C   Date of discharge: 05/12/2015  Admitting diagnosis: INDUCTION Intrauterine pregnancy: [redacted]w[redacted]d     Secondary diagnosis:  Active Problems:   Gestational diabetes SVD  Additional problems: none     Discharge diagnosis: Term Pregnancy Delivered and GDM A1                                                                                                Post partum procedures:none  Augmentation: AROM, Pitocin, Cytotec and Foley Balloon  Complications: None  Hospital course:  Induction of Labor With Vaginal Delivery   25 y.o. yo G2P1011 at [redacted]w[redacted]d was admitted to the hospital 05/09/2015 for induction of labor.  Indication for induction: A1 DM.  Patient had an uncomplicated labor course as follows: She presented for IOL and had Cytotec followed by Foley balloon followed by pitocin and AROM.  She progressed to complete and had an uncomplicated SVD. Membrane Rupture Time/Date: 10:57 AM ,05/10/2015   Intrapartum Procedures: Episiotomy: None [1]                                         Lacerations:  None [1]  Patient had delivery of a Viable infant.  Information for the patient's newborn:  Anna Stout [[id number]]  Delivery Method: Vaginal, Spontaneous Delivery (Filed from Delivery Summary)   05/10/2015  Details of delivery can be found in separate delivery note.  Patient had a routine postpartum course. Patient is discharged home 05/12/2015.   Physical exam  Filed Vitals:   05/11/15 0551 05/11/15 0603 05/11/15 1859 05/12/15 0654  BP: 76/47 101/48 97/49 92/62   Pulse: 75 80 75   Temp: 98 F (36.7 C)  97.7 F (36.5 C)   TempSrc:   Oral   Resp: 18  18   Height:      Weight:      SpO2:       General: alert, cooperative and no distress Lochia: appropriate Uterine Fundus: firm Incision: N/A DVT  Evaluation: No evidence of DVT seen on physical exam. Labs: Lab Results  Component Value Date   WBC 11.4* 05/09/2015   HGB 11.6* 05/09/2015   HCT 33.2* 05/09/2015   MCV 80.6 05/09/2015   PLT 183 05/09/2015   CMP Latest Ref Rng 03/13/2010  Glucose 70 - 99 mg/dL 89  BUN 6 - 23 mg/dL 8  Creatinine 0.4 - 1.2 mg/dL 0.9  Sodium 782 - 956 mEq/L 138  Potassium 3.5 - 5.1 mEq/L 4.3  Chloride 96 - 112 mEq/L 102    Discharge instruction: per After Visit Summary and "Baby and Me Booklet".  After visit meds:    Medication List    STOP taking these medications        BENADRYL ALLERGY PO      TAKE these medications        acetaminophen 325 MG tablet  Commonly  known as:  TYLENOL  Take 2 tablets (650 mg total) by mouth every 4 (four) hours as needed (for pain scale < 4).     ibuprofen 600 MG tablet  Commonly known as:  ADVIL,MOTRIN  Take 1 tablet (600 mg total) by mouth every 6 (six) hours.     multivitamin-prenatal 27-0.8 MG Tabs tablet  Take 1 tablet by mouth daily at 12 noon.        Diet: routine diet  Activity: Advance as tolerated. Pelvic rest for 6 weeks.   Outpatient follow up:6 weeks Follow up Appt:No future appointments. Follow up Visit:No Follow-up on file.  Postpartum contraception: undecided but considering the IUD  Newborn Data: Live born female  Birth Weight: 6 lb 2.8 oz (2800 g) APGAR: 8, 9  Baby Feeding: Breast Disposition:home with mother   05/12/2015 Caesar ChestnutKelly E Garcia, MD   CNM attestation I have seen and examined this patient and agree with above documentation in the resident's note.   Anna Stout is a 25 y.o. G2P1011 s/p SVD.   Pain is well controlled.  Plan for birth control is undecided.  Method of Feeding: breast  PE:  BP 92/62 mmHg  Pulse 75  Temp(Src) 97.7 F (36.5 C) (Oral)  Resp 18  Ht 5\' 11"  (1.803 m)  Wt 117.935 kg (260 lb)  BMI 36.28 kg/m2  SpO2 100%  LMP 08/09/2014  Breastfeeding? Unknown Fundus firm  No  results for input(s): HGB, HCT in the last 72 hours.   Plan: discharge today - postpartum care discussed - f/u clinic in 6 weeks for postpartum visit   Tag Wurtz, CNM 10:19 PM

## 2015-05-12 NOTE — Lactation Note (Addendum)
This note was copied from a baby's chart. Lactation Consultation Note  Finger fed baby 7 ml. She was able to transfer most of it on her own with little prompting but did not have long jaw excursions.  Plan is for mom to pump and use syringe SNS at the breast at the next feeding.  Mom is able to get [name] attached to the breast and she feels comfortable inserting the tube into the corner of elise's mouth and then securing it with tape. She will then post pump and use the milk for the next feeding. Volumes guidelines were given to mother. Follow-up tomorrow. Patient Name: Anna Stout Today's Date: 05/12/2015 Reason for consult: Follow-up assessment   Maternal Data    Feeding Feeding Type: Breast Milk with Formula added Length of feed:  (not transferring)  LATCH Score/Interventions Latch: Repeated attempts needed to sustain latch, nipple held in mouth throughout feeding, stimulation needed to elicit sucking reflex.  Audible Swallowing: None  Type of Nipple: Everted at rest and after stimulation  Comfort (Breast/Nipple): Soft / non-tender     Hold (Positioning): Assistance needed to correctly position infant at breast and maintain latch.  LATCH Score: 6  Lactation Tools Discussed/Used     Consult Status Consult Status: Follow-up Date: 05/13/15 Follow-up type: In-patient    Soyla DryerJoseph, Dreama Kuna 05/12/2015, 5:19 PM

## 2015-05-12 NOTE — Discharge Instructions (Signed)

## 2015-05-13 ENCOUNTER — Ambulatory Visit: Payer: Self-pay

## 2015-05-13 NOTE — Lactation Note (Signed)
This note was copied from a baby's chart. Lactation Consultation Note Discharge today. Fingerfeeding well. INtroduced Special Needs Feeder to aid in suckling.  Easily transferred 15 ml.  Encouraged sucking exercises. OP appointment scheduled for Friday. [facility]  Mom denies questions at this point.  Patient Name: Anna Stout Today's Date: 05/13/2015 Reason for consult: Follow-up assessment   Maternal Data    Feeding Feeding Type: Breast Milk  LATCH Score/Interventions                      Lactation Tools Discussed/Used     Consult Status      Anna Stout, Anna Stout 05/13/2015, 11:05 AM

## 2015-05-18 ENCOUNTER — Ambulatory Visit (HOSPITAL_COMMUNITY)
Admission: RE | Admit: 2015-05-18 | Discharge: 2015-05-18 | Disposition: A | Discharge status: Home / Self Care | Payer: Medicaid Other | Source: Ambulatory Visit | Attending: Family Medicine | Admitting: Family Medicine

## 2015-05-18 NOTE — Lactation Note (Signed)
Lactation Consult  Mother's reason for visit:  Per  Mom Appt. Was set up by lactation nurse while I was still in the hospital after giving birth  Visit Type:  Feeding assessment  Appointment Notes:  Finger feeding , called to confirm appt. For 5/12 , but unable to leave a voice mail due to being full .  Consult:  Initial Lactation Consultant:  Kathrin Greathouse  ________________________________________________________________________ Joan Flores Name: Fortino Sic Date of Birth: 05/10/2017 Pediatrician:Dr.Pamela . Dockery / [facility]  Gender: female Gestational Age: [redacted]w[redacted]d (At Birth) Birth Weight: 6 lb 2.8 oz (2800 g) Weight at Discharge: Weight: 5 lb 11 oz (2580 g)Date of Discharge: 05/13/2015 Filed Weights   05/12/15 0036 05/12/15 1315 05/13/15 0010  Weight: 5 lb 13 oz (2635 g) 5 lb 10.5 oz (2565 g) 5 lb 11 oz (2580 g)   [facility] 5-[facility]  Location:[facility] Weight today: 2704 g , 5-15.4 oz       ________________________________________________________________________  Mother's Name: Martinique D Rodriquez-McKinney Type of delivery:  vaginal del .  Breastfeeding Experience: 1st baby  Maternal Medical Conditions:  Gestational diabetes mellitis, asthma , per mom since  Maternal Medications:  PNV   ________________________________________________________________________  Breastfeeding History (Post Discharge) - per mom breast feeding started off slow. I was not pumping  / or producing a lot of milk at 1st but every day has got'en better. Also my baby is latching better which is good   Frequency of breastfeeding: every thing 2-3 hours  Duration of feeding:  20 - 45 min ( per mom seems satisfied between feedings )   Pumping: has a Medela Hand pump from the hospital. And hasn't pumped since the hospital even when milk came in.  Per mom hasn't had any engorgement issues.  Also active  with WIC / [place]  If needed a DEBP   Supplementing: per mom , no not since D/C from the hospital   Infant Intake and Output Assessment  Voids:  6-8  in 24 hrs.  Color:  Clear yellow Stools: 3-4  in 24 hrs.  Color:  per mom light brown   ________________________________________________________________________  Maternal Breast Assessment  Breast:  Full Nipple:  Erect Pain level:  0 Pain interventions:  Expressed breast milk  _______________________________________________________________________ Feeding Assessment/Evaluation - baby woken up for the feeding.  last fed at home at 2: 30 pm for 20 mins Appears well hydrated, bottom pinky red , reminded mom to use vase line or diaper cream on the baby's bottom  As prevention.    Initial feeding assessment:  Infant's oral assessment:  Variance  Positioning:  Cross cradle Left breast  LATCH documentation:  Latch:  2 = Grasps breast easily, tongue down, lips flanged, rhythmical sucking.  Audible swallowing:  2 = Spontaneous and intermittent  Type of nipple:  2 = Everted at rest and after stimulation  Comfort (Breast/Nipple):  1 = Filling, red/small blisters or bruises, mild/mod discomfort  Hold (Positioning):  1 = Assistance needed to correctly position infant at breast and maintain latch  LATCH score: 8  Attached assessment:  Shallow @ 1st , worked on depth   Lips flanged:  No.- flipped upper lip to flanged position   Lips untucked:  Yes.    Suck assessment:  Nutritive  Tools:  None  Instructed on use and cleaning of tool:  No.  Pre-feed weight:  2704 g , 5-15.4 oz  Post-feed weight:  2714 g ,  5-15.7 oz  Amount transferred: 10 ml  Amount supplemented:  None   Additional Feeding Assessment -   Infant's oral assessment:  Variance  Positioning:  Football Right breast  LATCH documentation:  Latch:  2 = Grasps breast easily, tongue down, lips flanged, rhythmical sucking.  Audible swallowing:  2 = Spontaneous and  intermittent  Type of nipple:  2 = Everted at rest and after stimulation  Comfort (Breast/Nipple):  1 = Filling, red/small blisters or bruises, mild/mod discomfort  Hold (Positioning):  1 = Assistance needed to correctly position infant at breast and maintain latch  LATCH score:  8  Attached assessment:  Shallow @ 1st   Lips flanged:  No.  Lips untucked:  Yes.    Suck assessment:  Nutritive and Nonnutritive  Tools:  None  Instructed on use and cleaning of tool:  No.  Pre-feed weight: 2714 oz , 5-15.7 oz  Post-feed weight: 2752 g , 6-1.o oz  Amount transferred:  38 ml  Amount supplemented: none needed  Large stool - yellow - re-weight 6.0.9 oz - 2748 g  Baby satisfied after feeding and diaper change and went sleep    Total amount pumped post feed: did not post pump , baby fed off both breast 10 -15 mins   Total amount transferred: 48 ml  Total supplement given:  None   Lactation Impression :  Pennie BanterBaby Elise 637 days old , and is gaining weight steadily - 5-15.4 today  According to mom  and dad voids and stools adequate well at home  , and breast feeding  Is improving every day . Mom seemed very pleased and reassured weight is increasing.  @ consult baby latched shallow at 1st , but easily fixed the depth and fed 10 mins .  Baby seemed to be more efficient at the breast on the right compared to the left . Per mom the  Let - down is better on the right.  Slight oral variance - doesn't seem to interfere with latch and sustaining it.  See [facility] below for details for details.    Lactation Plan of Care :  Praised mom for her efforts breast feeding , caring for her baby , and dads support ( present at consult )  LC recommended due to F/U with Pedis is not until the end of May on the 31 th, to attend the Breast feeding  support group for weight check next Monday evening at 7 pm [facility],  Also the following week until on the 3rd week Dr. F/U .   Feedings days - 2 1/2 - 3 hours , and at night by 3 hours until baby is gaining steadily  Goal to establish and protect milk supply  Get [name] gaining steadily  Watching for non - nutritive " Hanging out " feeding behaviors  Work on latching at depth at the breast - when latching tickle [name] upper lip until she opens wide, latch with breast compressions  Until swallows , and then intermittent.  Extra - pumping - if [name] only feeds on one breast , release the 2nd breast down to comfort - hand express or pump.

## 2015-06-19 ENCOUNTER — Ambulatory Visit: Payer: Medicaid Other | Admitting: Obstetrics and Gynecology

## 2015-07-11 ENCOUNTER — Ambulatory Visit (INDEPENDENT_AMBULATORY_CARE_PROVIDER_SITE_OTHER): Payer: Medicaid Other | Admitting: Advanced Practice Midwife

## 2015-07-11 ENCOUNTER — Encounter: Payer: Self-pay | Admitting: Advanced Practice Midwife

## 2015-07-11 ENCOUNTER — Encounter: Payer: Self-pay | Admitting: Family Medicine

## 2015-07-11 VITALS — BP 122/70 | HR 75 | Temp 98.4°F | Ht 71.0 in | Wt 254.4 lb

## 2015-07-11 DIAGNOSIS — L409 Psoriasis, unspecified: Secondary | ICD-10-CM

## 2015-07-11 NOTE — Progress Notes (Signed)
Subjective:     Anna Stout is a 25 y.o. female who presents for a postpartum visit. She is 8 weeks postpartum following a spontaneous vaginal delivery. I have fully reviewed the prenatal and intrapartum course. The delivery was at 39 gestational weeks. Outcome: spontaneous vaginal delivery. Anesthesia: epidural. Postpartum course has been uneventful. Baby's course has been uneventful. Baby is feeding by breast. Bleeding no bleeding. Bowel function is normal. Bladder function is normal. Patient is sexually active. Contraception method is none. Postpartum depression screening: negative.  The following portions of the patient's history were reviewed and updated as appropriate: allergies, current medications, past family history, past medical history, past social history, past surgical history and problem list.  Review of Systems Pertinent items are noted in HPI.   Objective:    There were no vitals taken for this visit.  General:  alert and cooperative   Breasts:  inspection negative, no nipple discharge or bleeding, no masses or nodularity palpable  Lungs: Resp unlabored  Heart:  Regular rate and rhythm  Abdomen: soft, non-tender; bowel sounds normal; no masses,  no organomegaly   Vulva:  not evaluated  Vagina: not evaluated  Cervix:  n/a  Corpus: not examined  Adnexa:  not evaluated  Rectal Exam: Not performed.        Assessment:     Normal postpartum exam. Pap smear not done at today's visit.   Plan:    1. Contraception: none 2. Long discussion about contraception. Was worried because of lawyer ads she saw on TV, thinks contraception might be risky. Discussed there are also risks to health with pregnancy, such as hypercoagulability.  Risks and benefits reviewed. Different options reviewed.  3. Follow up in: 1 year or as needed.

## 2015-07-11 NOTE — Patient Instructions (Addendum)
Contraception Choices Contraception (birth control) is the use of any methods or devices to prevent pregnancy. Below are some methods to help avoid pregnancy. HORMONAL METHODS   Contraceptive implant. This is a thin, plastic tube containing progesterone hormone. It does not contain estrogen hormone. Your health care provider inserts the tube in the inner part of the upper arm. The tube can remain in place for up to 3 years. After 3 years, the implant must be removed. The implant prevents the ovaries from releasing an egg (ovulation), thickens the cervical mucus to prevent sperm from entering the uterus, and thins the lining of the inside of the uterus.  Progesterone-only injections. These injections are given every 3 months by your health care provider to prevent pregnancy. This synthetic progesterone hormone stops the ovaries from releasing eggs. It also thickens cervical mucus and changes the uterine lining. This makes it harder for sperm to survive in the uterus.  Birth control pills. These pills contain estrogen and progesterone hormone. They work by preventing the ovaries from releasing eggs (ovulation). They also cause the cervical mucus to thicken, preventing the sperm from entering the uterus. Birth control pills are prescribed by a health care provider.Birth control pills can also be used to treat heavy periods.  Minipill. This type of birth control pill contains only the progesterone hormone. They are taken every day of each month and must be prescribed by your health care provider.  Birth control patch. The patch contains hormones similar to those in birth control pills. It must be changed once a week and is prescribed by a health care provider.  Vaginal ring. The ring contains hormones similar to those in birth control pills. It is left in the vagina for 3 weeks, removed for 1 week, and then a new one is put back in place. The patient must be comfortable inserting and removing the ring  from the vagina.A health care provider's prescription is necessary.  Emergency contraception. Emergency contraceptives prevent pregnancy after unprotected sexual intercourse. This pill can be taken right after sex or up to 5 days after unprotected sex. It is most effective the sooner you take the pills after having sexual intercourse. Most emergency contraceptive pills are available without a prescription. Check with your pharmacist. Do not use emergency contraception as your only form of birth control. BARRIER METHODS   Female condom. This is a thin sheath (latex or rubber) that is worn over the penis during sexual intercourse. It can be used with spermicide to increase effectiveness.  Female condom. This is a soft, loose-fitting sheath that is put into the vagina before sexual intercourse.  Diaphragm. This is a soft, latex, dome-shaped barrier that must be fitted by a health care provider. It is inserted into the vagina, along with a spermicidal jelly. It is inserted before intercourse. The diaphragm should be left in the vagina for 6 to 8 hours after intercourse.  Cervical cap. This is a round, soft, latex or plastic cup that fits over the cervix and must be fitted by a health care provider. The cap can be left in place for up to 48 hours after intercourse.  Sponge. This is a soft, circular piece of polyurethane foam. The sponge has spermicide in it. It is inserted into the vagina after wetting it and before sexual intercourse.  Spermicides. These are chemicals that kill or block sperm from entering the cervix and uterus. They come in the form of creams, jellies, suppositories, foam, or tablets. They do not require a   prescription. They are inserted into the vagina with an applicator before having sexual intercourse. The process must be repeated every time you have sexual intercourse. INTRAUTERINE CONTRACEPTION  Intrauterine device (IUD). This is a T-shaped device that is put in a woman's uterus  during a menstrual period to prevent pregnancy. There are 2 types:  Copper IUD. This type of IUD is wrapped in copper wire and is placed inside the uterus. Copper makes the uterus and fallopian tubes produce a fluid that kills sperm. It can stay in place for 10 years.  Hormone IUD. This type of IUD contains the hormone progestin (synthetic progesterone). The hormone thickens the cervical mucus and prevents sperm from entering the uterus, and it also thins the uterine lining to prevent implantation of a fertilized egg. The hormone can weaken or kill the sperm that get into the uterus. It can stay in place for 3-5 years, depending on which type of IUD is used. PERMANENT METHODS OF CONTRACEPTION  Female tubal ligation. This is when the woman's fallopian tubes are surgically sealed, tied, or blocked to prevent the egg from traveling to the uterus.  Hysteroscopic sterilization. This involves placing a small coil or insert into each fallopian tube. Your doctor uses a technique called hysteroscopy to do the procedure. The device causes scar tissue to form. This results in permanent blockage of the fallopian tubes, so the sperm cannot fertilize the egg. It takes about 3 months after the procedure for the tubes to become blocked. You must use another form of birth control for these 3 months.  Female sterilization. This is when the female has the tubes that carry sperm tied off (vasectomy).This blocks sperm from entering the vagina during sexual intercourse. After the procedure, the man can still ejaculate fluid (semen). NATURAL PLANNING METHODS  Natural family planning. This is not having sexual intercourse or using a barrier method (condom, diaphragm, cervical cap) on days the woman could become pregnant.  Calendar method. This is keeping track of the length of each menstrual cycle and identifying when you are fertile.  Ovulation method. This is avoiding sexual intercourse during ovulation.  Symptothermal  method. This is avoiding sexual intercourse during ovulation, using a thermometer and ovulation symptoms.  Post-ovulation method. This is timing sexual intercourse after you have ovulated. Regardless of which type or method of contraception you choose, it is important that you use condoms to protect against the transmission of sexually transmitted infections (STIs). Talk with your health care provider about which form of contraception is most appropriate for you.   This information is not intended to replace advice given to you by your health care provider. Make sure you discuss any questions you have with your health care provider.   Document Released: 12/23/2004 Document Revised: 12/28/2012 Document Reviewed: 06/17/2012 Elsevier Interactive Patient Education 2016 [employer]  

## 2015-07-18 ENCOUNTER — Other Ambulatory Visit: Payer: Medicaid Other

## 2015-07-18 DIAGNOSIS — Z8632 Personal history of gestational diabetes: Secondary | ICD-10-CM

## 2017-04-13 IMAGING — US US MFM UA CORD DOPPLER
1 series · 13 of 22 positions shown · non-contrast
Comparison: none

[Series 1: us mfm ua cord doppler · 22 acquisitions, 13 frames shown]
[im 1/22]
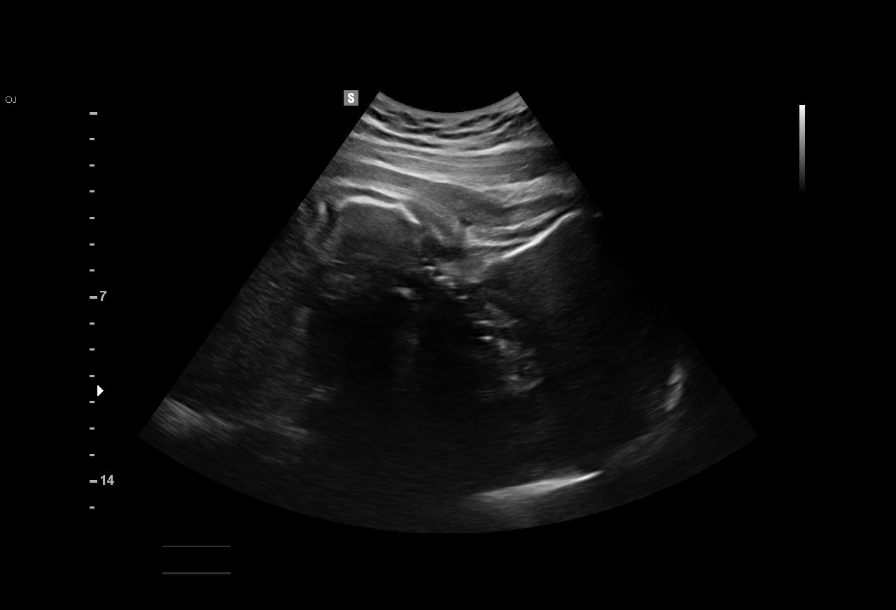
[im 3/22]
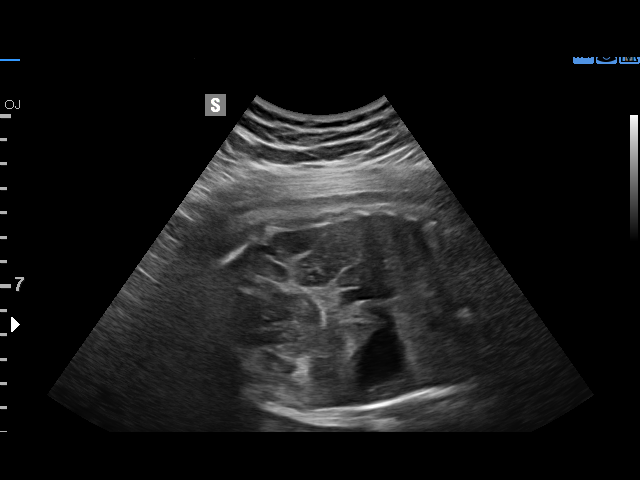
[im 5/22]
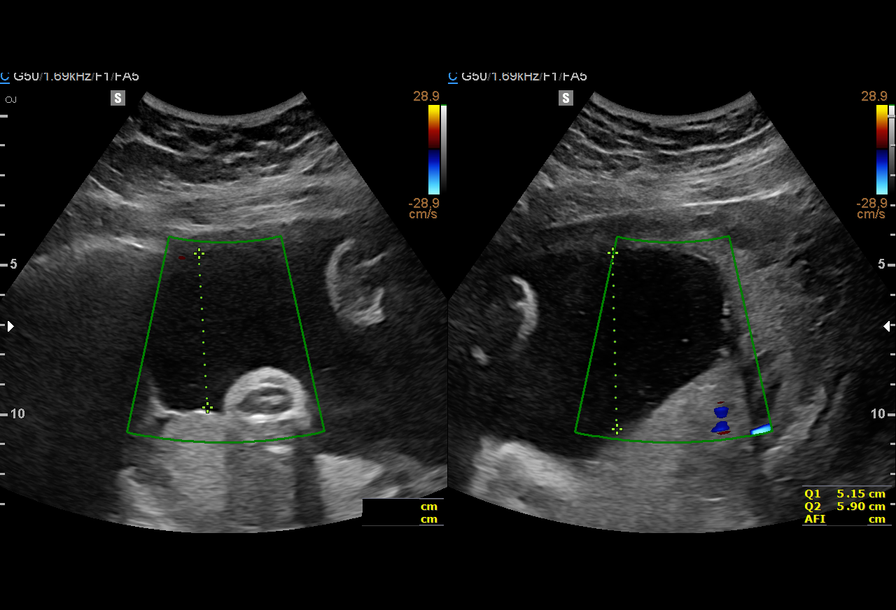
[im 6/22]
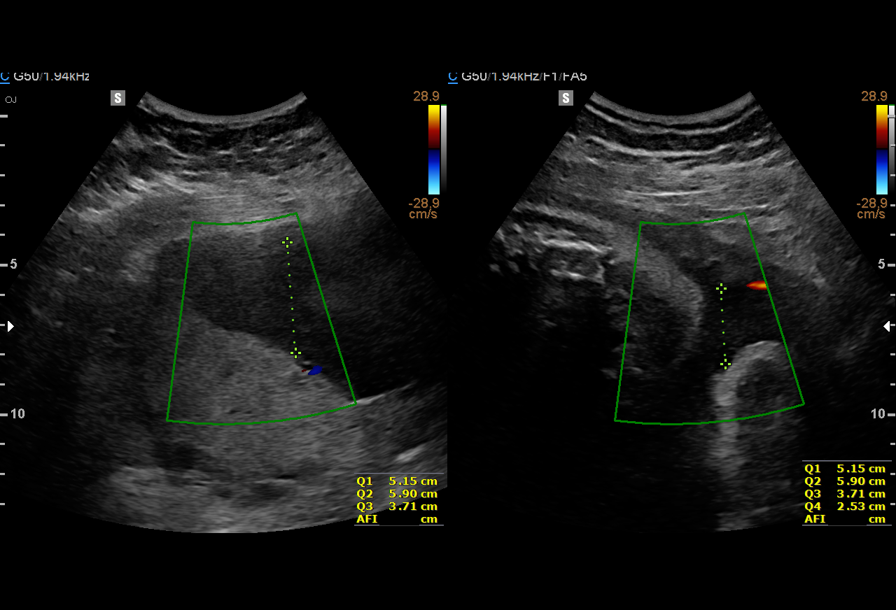
[im 8/22]
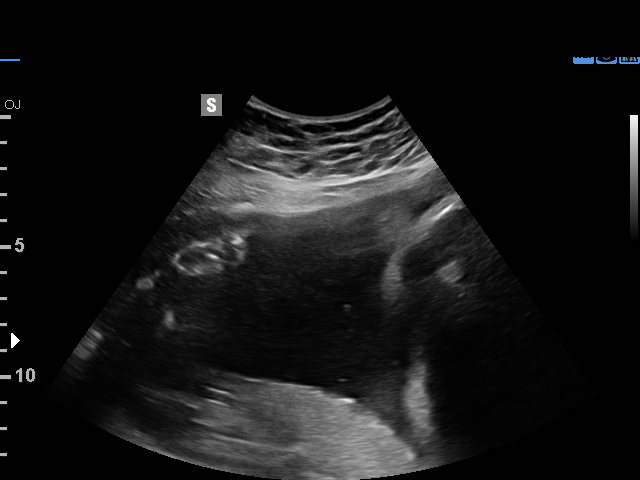
[im 10/22]
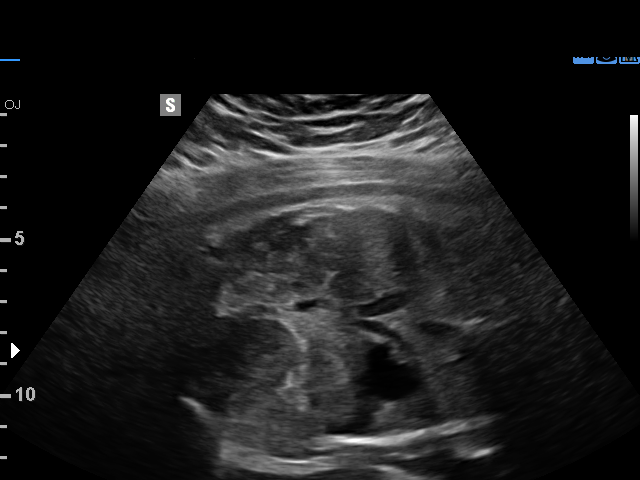
[im 12/22]
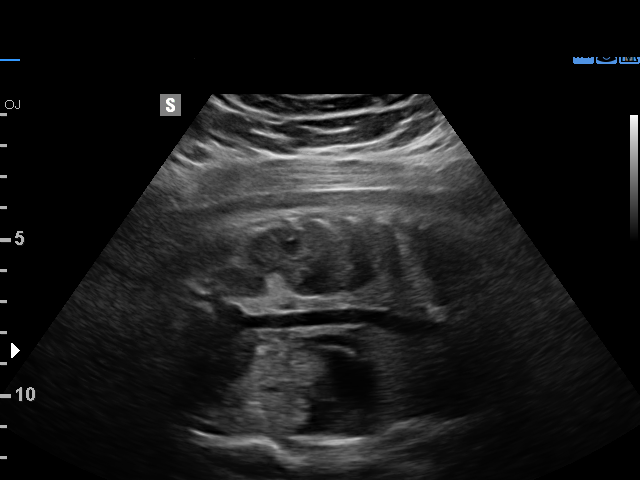
[im 13/22]
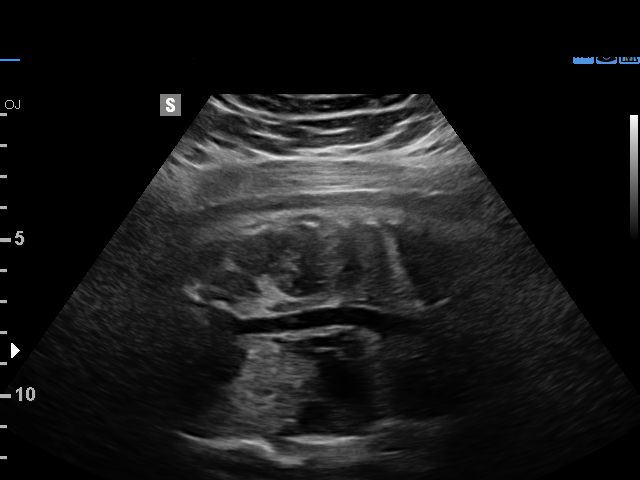
[im 15/22]
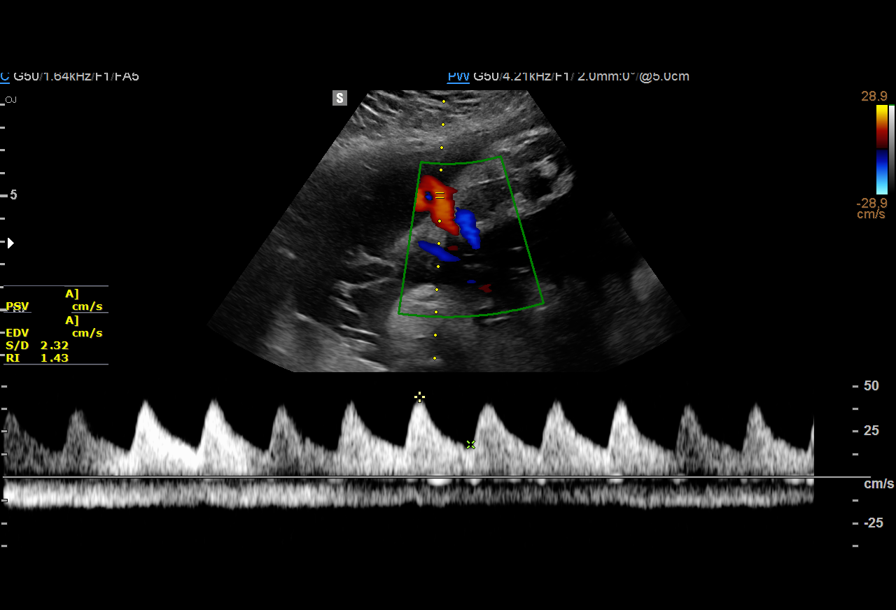
[im 17/22]
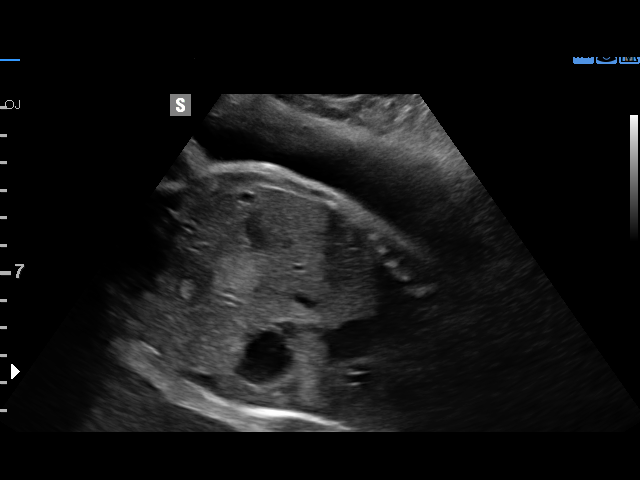
[im 18/22]
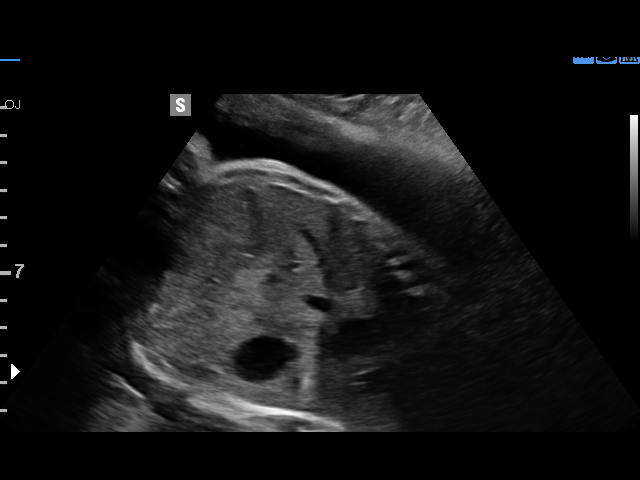
[im 20/22]
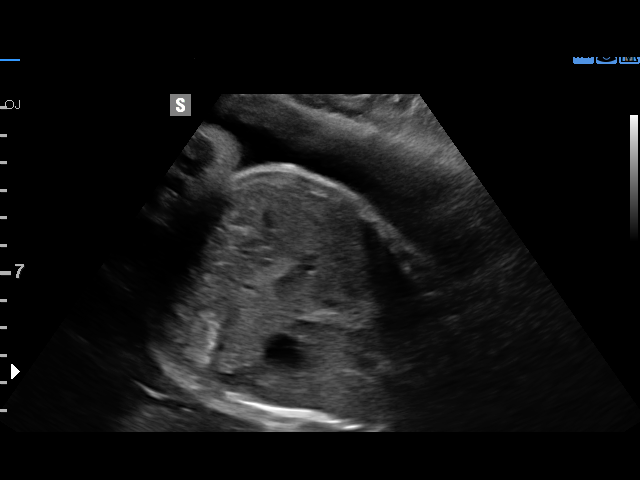
[im 22/22]
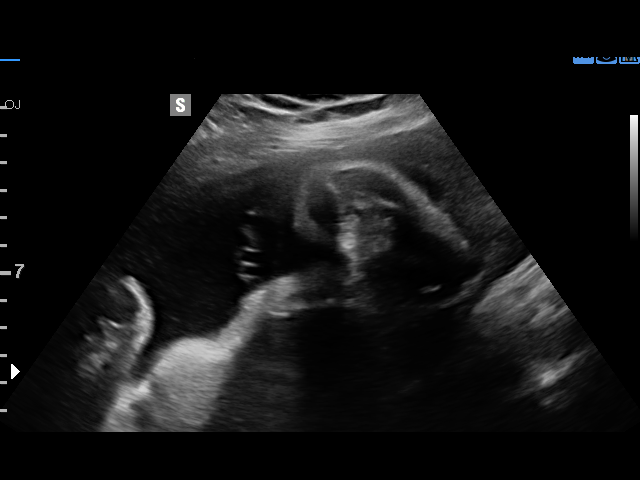

[13 of 22 positions shown; findings below may reference images not displayed]

[facility]

[facility]
[REDACTED]-
Faculty Physician

Indications

35 weeks gestation of pregnancy
Maternal care for known or suspected poor
fetal growth, third trimester, not applicable or
unspecified
Gestational diabetes in pregnancy, diet
controlled
Obesity complicating pregnancy, third
trimester
OB History

Gravidity:    2         Term:   0        Prem:   0         SAB:   1
TOP:          0       Ectopic:  0        Living: 0
Fetal Evaluation

Num Of Fetuses:     1
Fetal Heart         145
Rate(bpm):
Cardiac Activity:   Observed
Presentation:       Cephalic

Amniotic Fluid
AFI FV:      Subjectively within normal limits
AFI Sum:     17.29    cm      64  %Tile      Larg Pckt:    5.9  cm
RUQ:   5.15    cm   RLQ:    5.9    cm    LUQ:   3.71    cm    LLQ:   2.53    cm
Biophysical Evaluation

Amniotic F.V:   Within normal limits       F. Tone:         Observed
F. Movement:    Observed                   Score:           [DATE]
F. Breathing:   Observed
Gestational Age

LMP:           35w 0d        Date:  08/09/14                 EDD:    05/16/15
Best:          35w 0d     Det. By:  LMP  (08/09/14)          EDD:    05/16/15
Doppler - Fetal Vessels

Umbilical Artery
S/D     %tile
2.71        62

Impression

SIUP at 35+0 weeks
Cephalic presentation
Normal amniotic fluid volume
BPP [DATE]
UA dopplers were normal for this GA
Recommendations

Weekly AFIs and UA dopplers
Twice weekly NSTs with weekly AFIs
If all testing normal, suggest delivery by 39 weeks

## 2017-04-27 IMAGING — US US MFM OB FOLLOW-UP
1 series · 13 of 28 positions shown · non-contrast
Comparison: none

[Series 1: us mfm ob follow-up · 38 acquisitions, 13 frames shown]
[im 2/38]
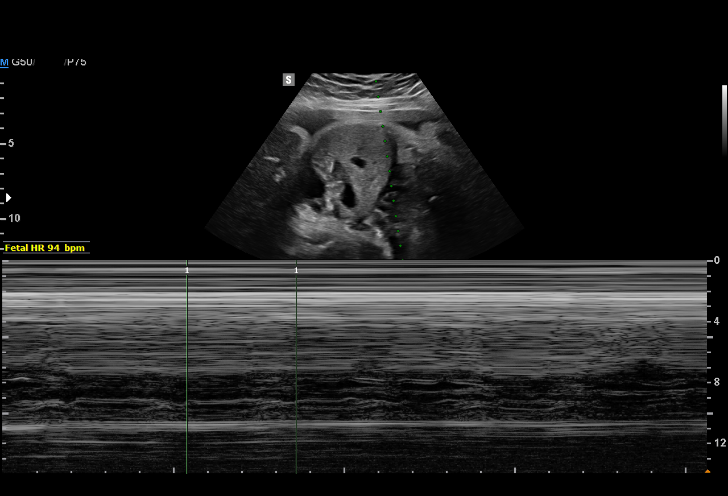
[im 5/38]
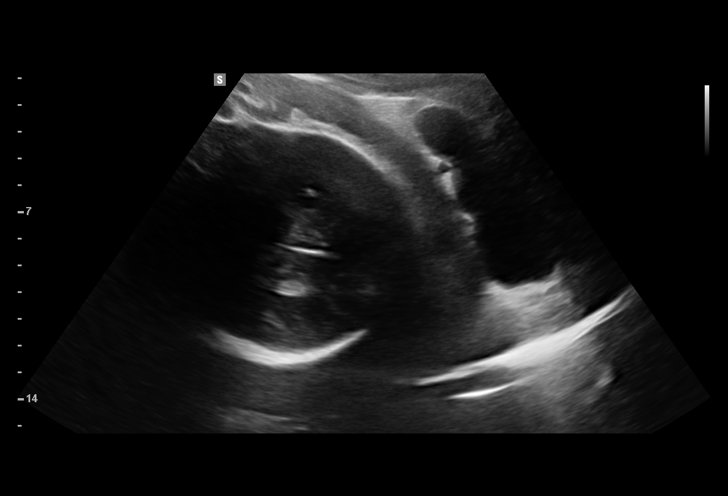
[im 7/38]
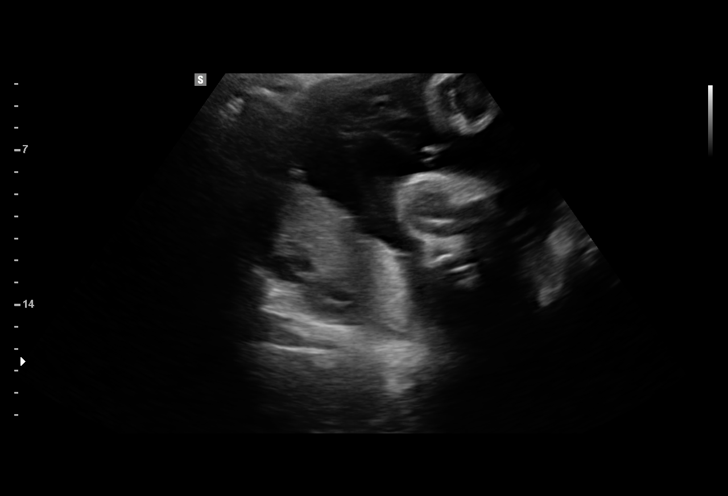
[im 10/38]
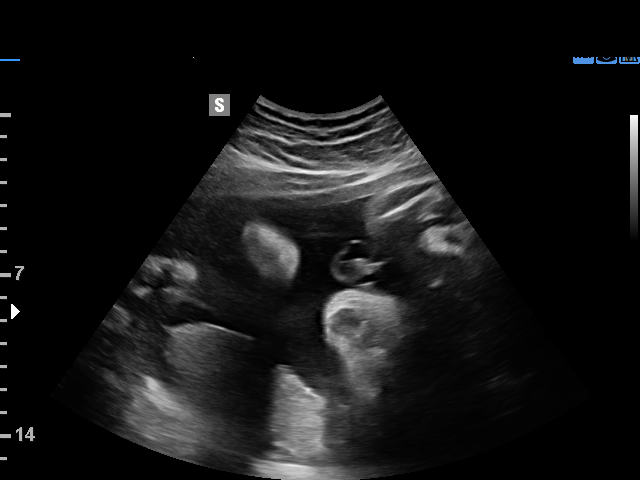
[im 13/38]
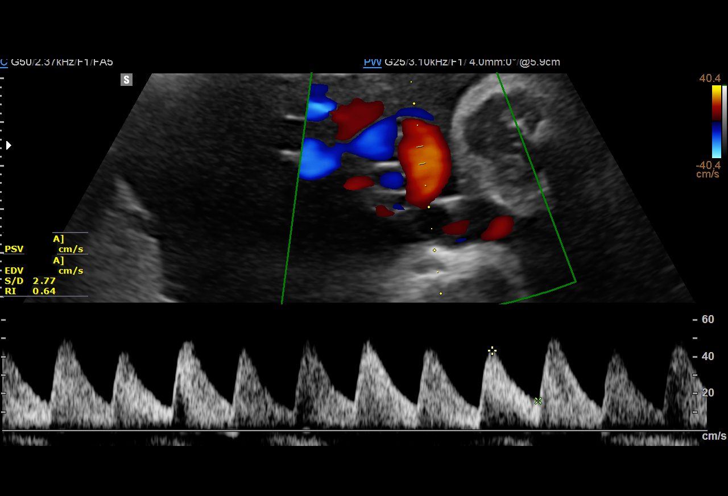
[im 16/38]
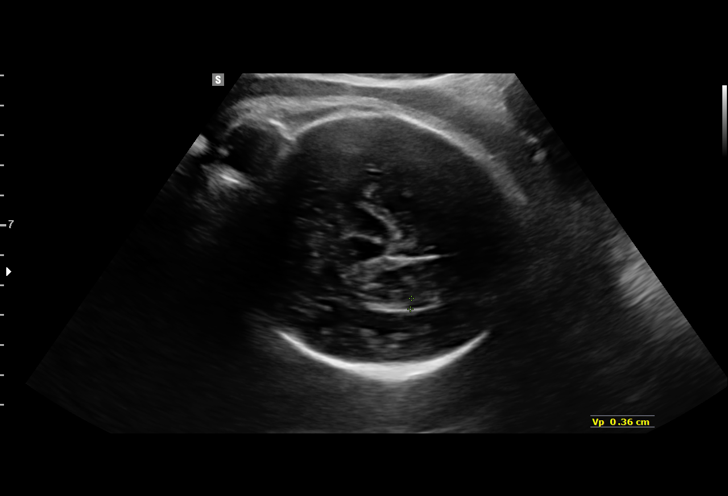
[im 20/38]
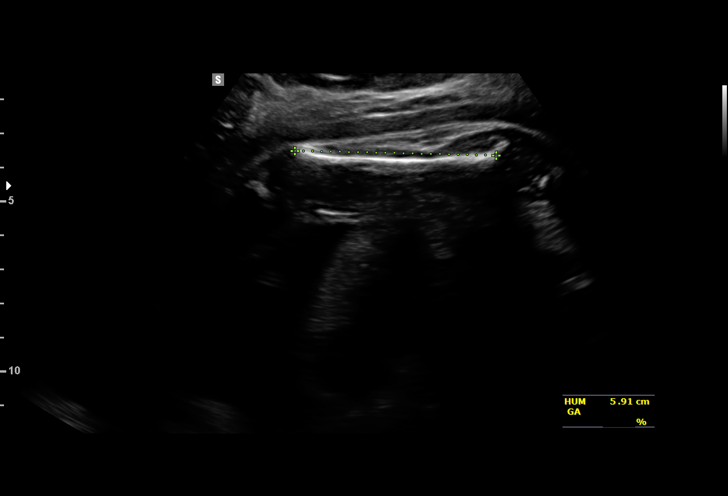
[im 22/38]
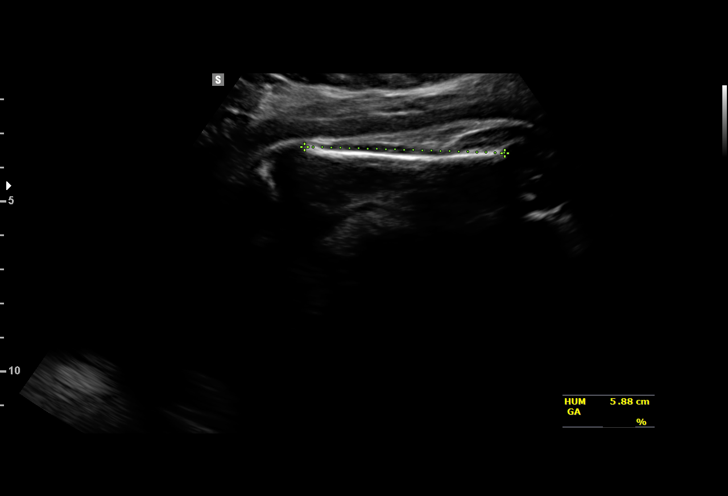
[im 25/38]
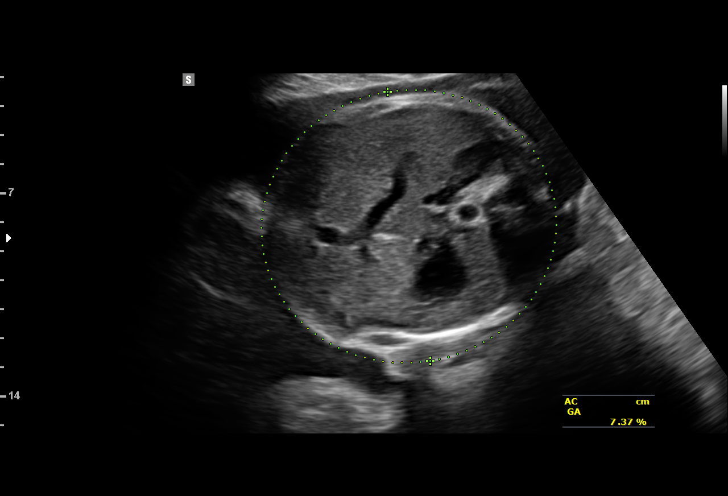
[im 28/38]
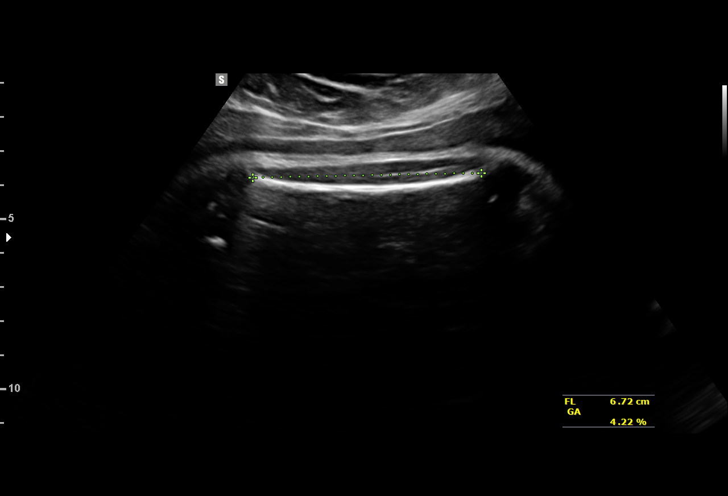
[im 31/38]
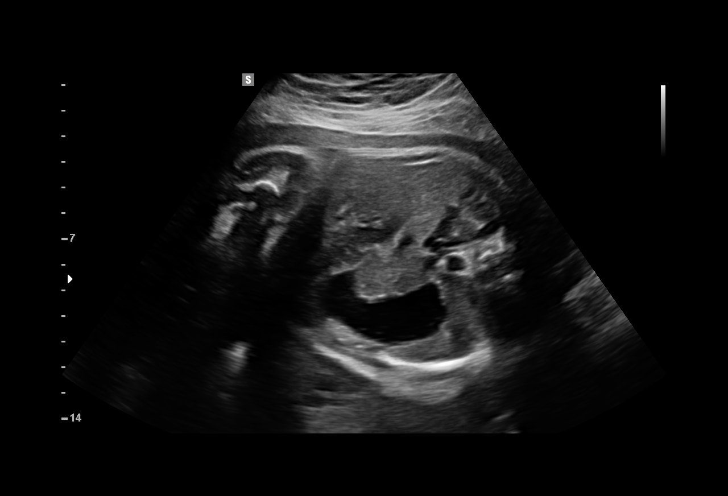
[im 33/38]
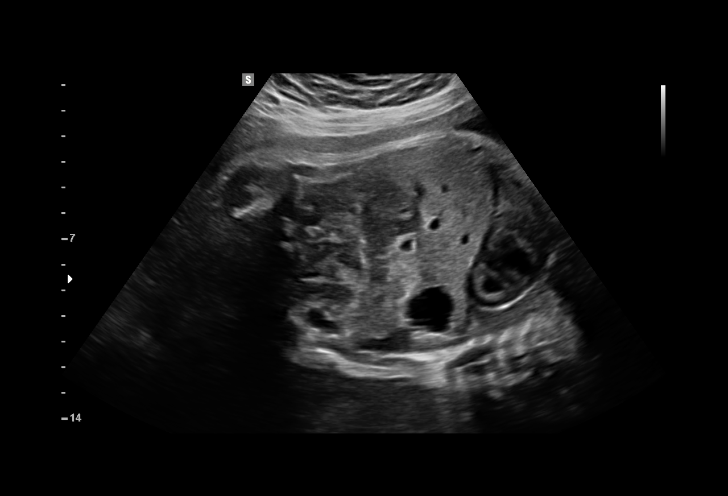
[im 36/38]
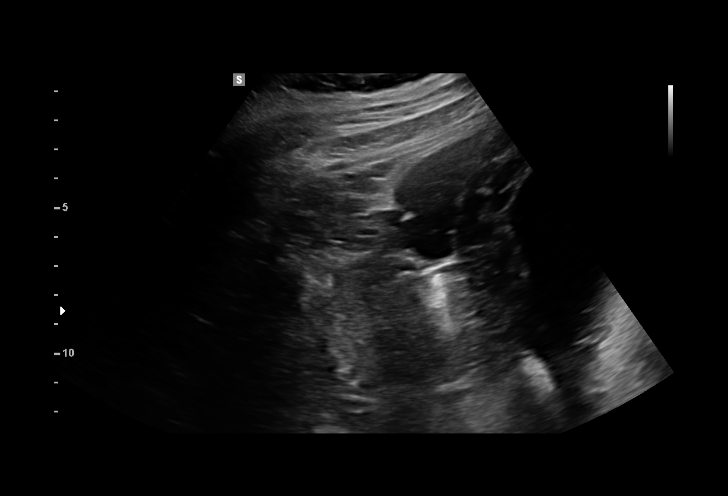

[13 of 28 positions shown; findings below may reference images not displayed]

[facility]

[facility]
[REDACTED]-
Faculty Physician

1  KLAJVER WETS           [id number]      [id number]     [id number]
2  KLAJVER WETS           [id number]      [id number]     [id number]
3  INGUNN HARPA RONLOR            [id number]      [id number]     [id number]
Indications

Maternal care for known or suspected poor
fetal growth, third trimester, not applicable or
unspecified
Gestational diabetes in pregnancy, diet
controlled
Obesity complicating pregnancy, third
trimester
37 weeks gestation of pregnancy
OB History

Gravidity:    2         Term:   0        Prem:   0        SAB:   1
TOP:          0       Ectopic:  0        Living: 0
Fetal Evaluation

Num Of Fetuses:     1
Fetal Heart         143
Rate(bpm):
Cardiac Activity:   Observed
Presentation:       Cephalic
Placenta:           Posterior, above cervical os
P. Cord Insertion:  Previously Visualized
Amniotic Fluid
AFI FV:      Subjectively within normal limits
AFI Sum:     19.15   cm      73   %Tile     Larg Pckt:   7.17   cm
RUQ:   7.17   cm    RLQ:    4.2    cm    LUQ:   5.03    cm   LLQ:    2.75   cm

Comment:    Fetal decel noted (94 bpm)with spontaneous recovery to 143
bpm.
Biophysical Evaluation

Amniotic F.V:   Pocket => 2 cm two         F. Tone:        Observed
planes
F. Movement:    Observed                   Score:          [DATE]
F. Breathing:   Observed
Biometry

BPD:      86.4  mm     G. Age:  34w 6d                  CI:         76.1   %   70 - 86
FL/HC:      21.2   %   20.8 -
HC:      313.9  mm     G. Age:  35w 1d        < 3  %    HC/AC:      1.03       0.92 -
AC:      304.5  mm     G. Age:  34w 3d          6  %    FL/BPD:     77.2   %   71 - 87
FL:       66.7  mm     G. Age:  34w 2d          3  %    FL/AC:      21.9   %   20 - 24
HUM:      58.8  mm     G. Age:  34w 0d         14  %
Est. FW:    5335  gm      5 lb 6 oz     18  %
Gestational Age

LMP:           37w 0d       Date:   08/09/14                 EDD:   05/16/15
U/S Today:     34w 5d                                        EDD:   06/01/15
Best:          37w 0d    Det. By:   LMP  (08/09/14)          EDD:   05/16/15
Anatomy

Cranium:          Appears normal         Aortic Arch:      Previously seen
Fetal Cavum:      Previously seen        Ductal Arch:      Previously seen
Ventricles:       Appears normal         Diaphragm:        Appears normal
Choroid Plexus:   Previously seen        Stomach:          Appears normal, left
sided
Cerebellum:       Previously seen        Abdomen:          Appears normal
Posterior Fossa:  Previously seen        Abdominal Wall:   Previously seen
Nuchal Fold:      Previously seen        Cord Vessels:     Previously seen
Face:             Orbits and profile     Kidneys:          Appear normal
previously seen
Lips:             Previously seen        Bladder:          Appears normal
Fetal Thoracic:   Appears normal         Spine:            Previously seen
Heart:            Appears normal         Upper             Previously seen
(4CH, axis, and        Extremities:
situs)
RVOT:             Previously seen        Lower             Previously seen
Extremities:
LVOT:             Previously seen

Other:  Female gender previously seen. Heels and 5th digit previously seen.
Doppler - Fetal Vessels

Umbilical Artery
S/D     %tile                                     PSV    ADFV    RDFV
(cm/s)
3.[REDACTED]      No      No
Cervix Uterus Adnexa

Cervix
Not visualized (advanced GA >40wks)

Left Ovary
Previously seen.

Right Ovary
Previously seen

Adnexa:       No abnormality visualized.
Impression

Single IUP at 37w 0d
Follow up due to lagging growth
The estimated fetal weight today is at the 18th %tile. The AC
measures at the 6th %tile.
UA Doppler studies: high normal S/D ratio (90th %tile).  No
AEDF or REDF noted
BPP [DATE]
Normal amniotic fluid volume
Recommendations

Recommend fetal strip due [REDACTED]f fetal deceleration - patient
notified and will return to clinic for testing
Continue antenatal testing as scheduled
Delivery at 39 weeks in the absence of other complications

## 2017-05-04 IMAGING — US US MFM UA CORD DOPPLER
1 series · 15 of 28 positions shown · non-contrast
Comparison: none

[Series 1: us mfm ua cord doppler · 29 acquisitions, 15 frames shown]
[im 1/29]
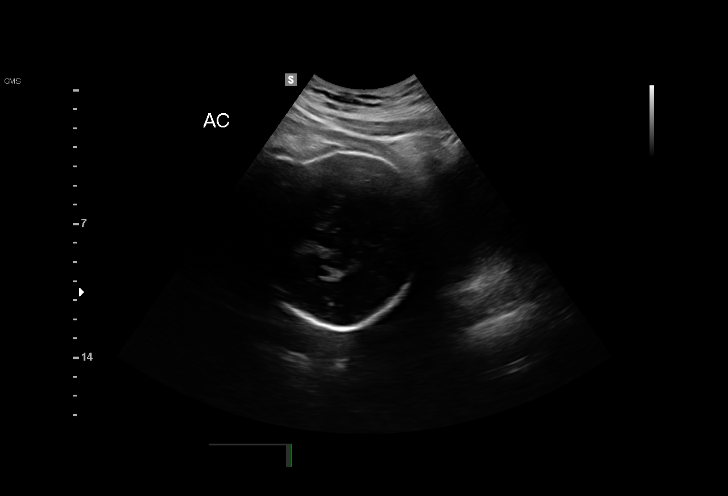
[im 3/29]
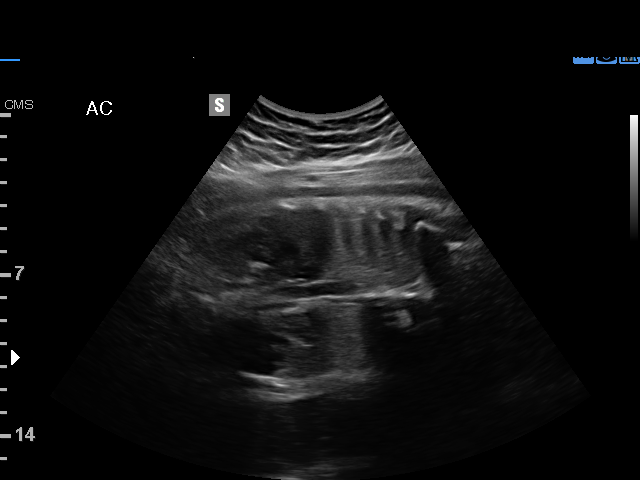
[im 5/29]
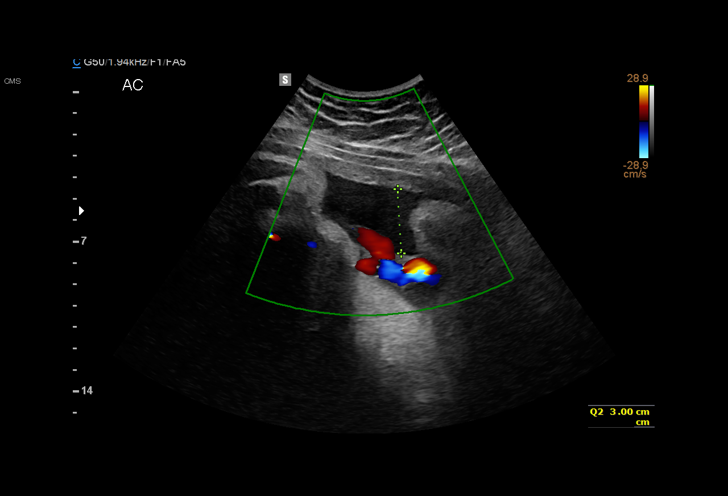
[im 7/29]
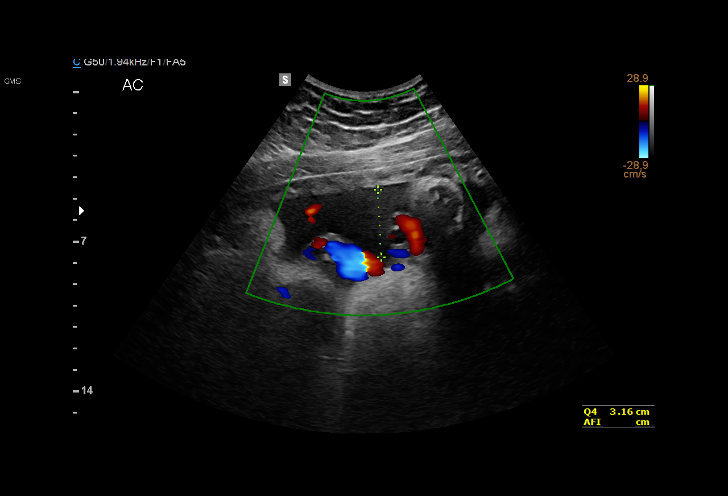
[im 9/29]
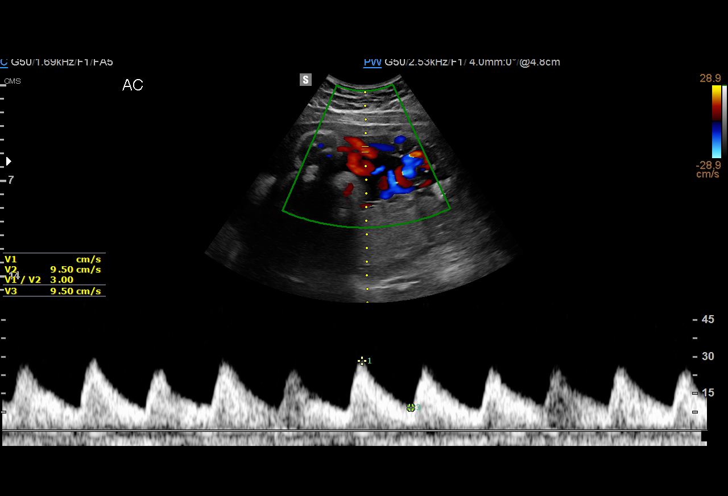
[im 11/29]
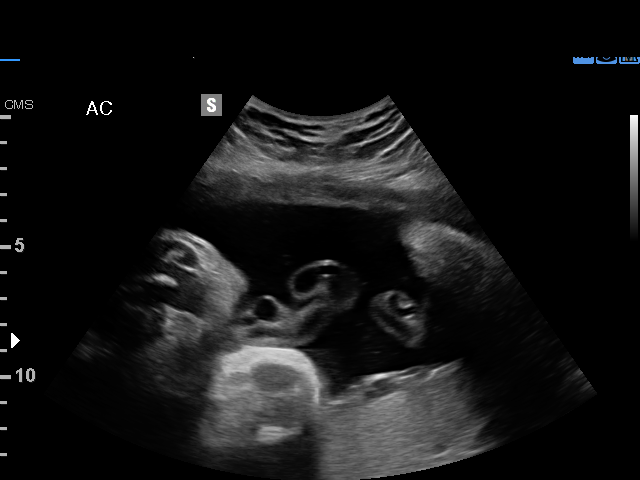
[im 13/29]
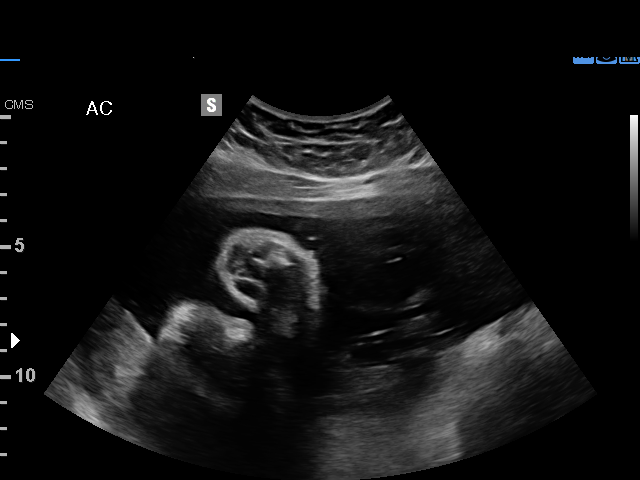
[im 15/29]
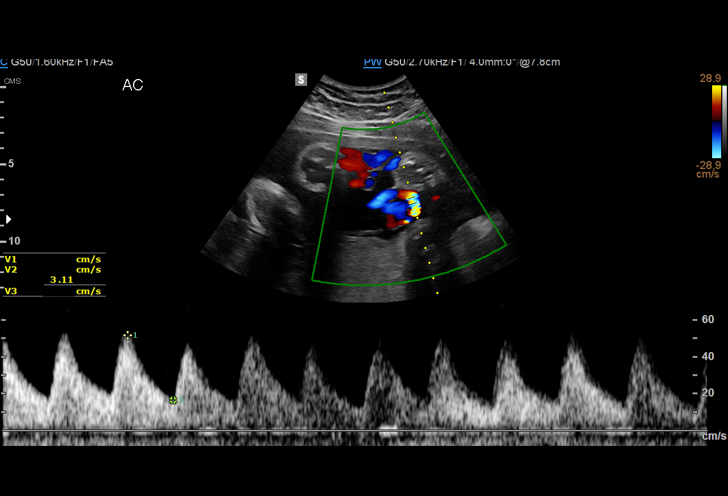
[im 16/29]
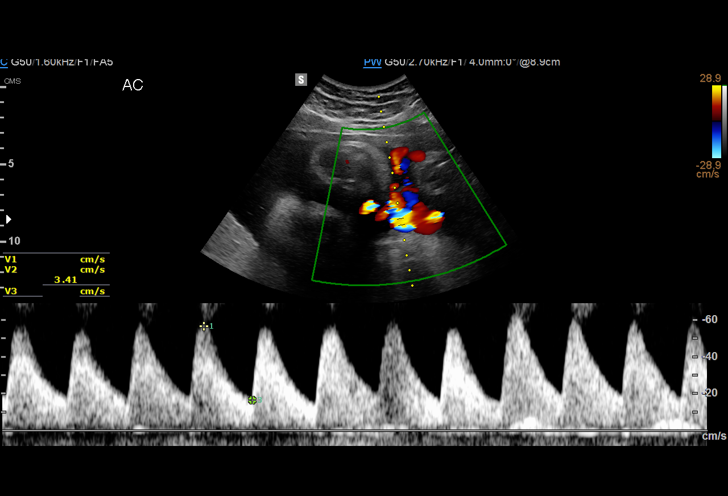
[im 18/29]
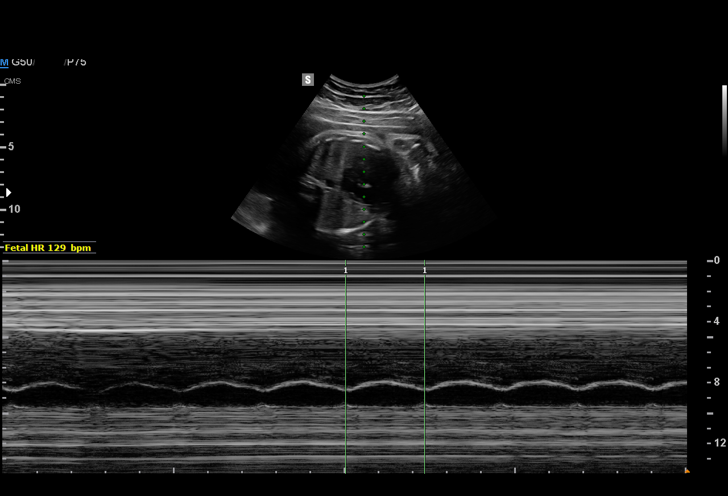
[im 20/29]
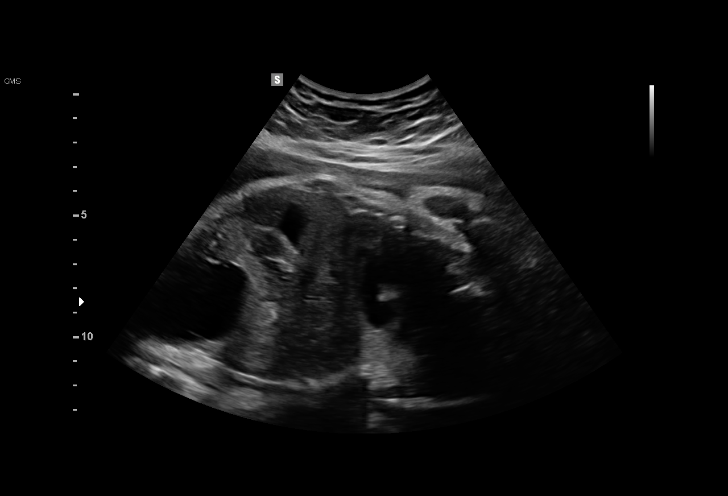
[im 22/29]
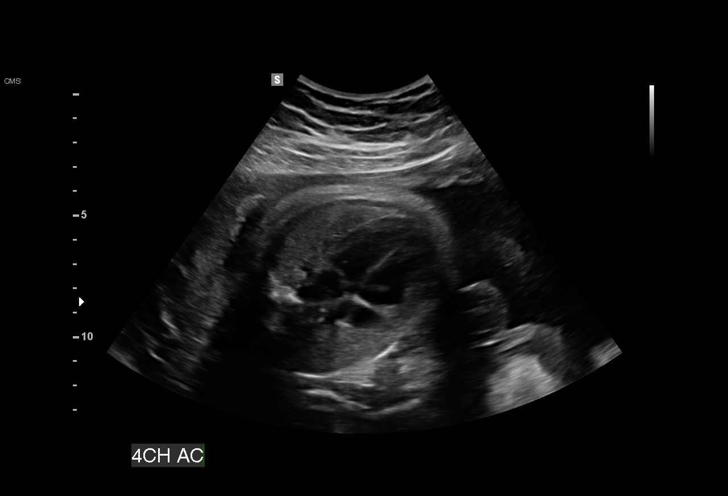
[im 24/29]
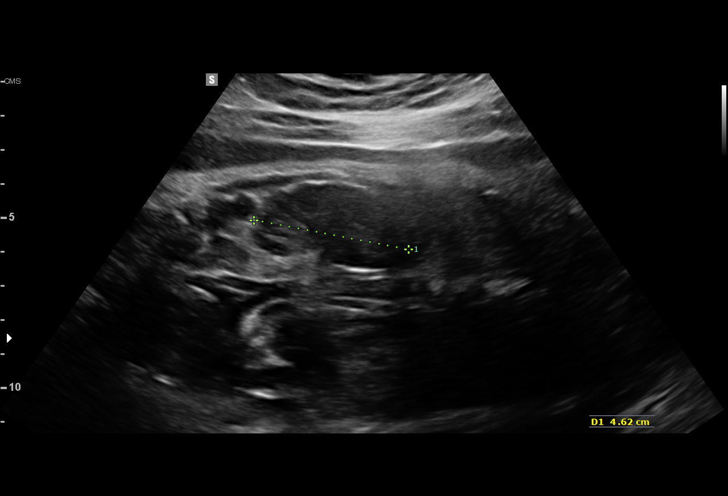
[im 26/29]
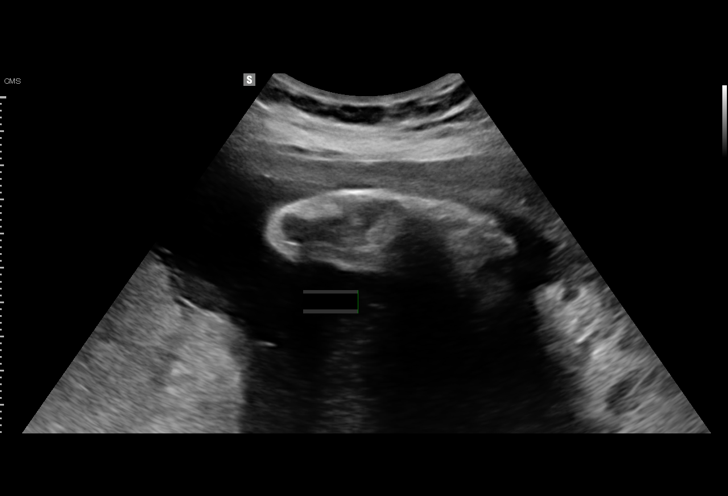
[im 29/29]
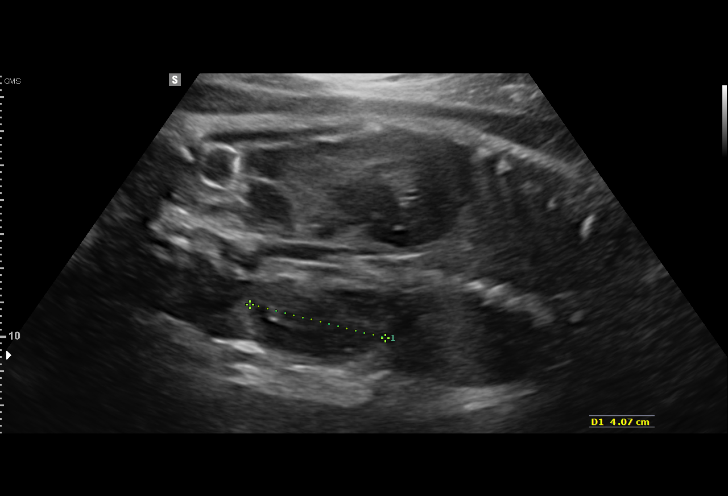

[15 of 28 positions shown; findings below may reference images not displayed]

[facility]

[facility] Clinic
[REDACTED]-
Faculty Physician

1  NOEMICITA FV           [id number]      [id number]     [id number]
2  NOEMICITA FV           [id number]      [id number]     [id number]
Indications

38 weeks gestation of pregnancy
Maternal care for known or suspected poor
fetal growth, third trimester, not applicable or
unspecified
Gestational diabetes in pregnancy, diet
controlled
Obesity complicating pregnancy, third
trimester
OB History

Gravidity:    2         Term:   0        Prem:   0        SAB:   1
TOP:          0       Ectopic:  0        Living: 0
Fetal Evaluation

Num Of Fetuses:     1
Fetal Heart         129
Rate(bpm):
Cardiac Activity:   Observed
Presentation:       Cephalic
Placenta:           Posterior, above cervical os
Amniotic Fluid
AFI FV:      Subjectively within normal limits

AFI Sum(cm)     %Tile       Largest Pocket(cm)
14.74           56

RUQ(cm)       RLQ(cm)       LUQ(cm)        LLQ(cm)
4.81          3
Biophysical Evaluation

Amniotic F.V:   Within normal limits       F. Tone:        Observed
F. Movement:    Observed                   Score:          [DATE]
F. Breathing:   Observed
Gestational Age

LMP:           38w 0d       Date:   08/09/14                 EDD:   05/16/15
Best:          38w 0d    Det. By:   LMP  (08/09/14)          EDD:   05/16/15
Doppler - Fetal Vessels

Umbilical Artery
S/D     %tile                                            ADFV    RDFV
3.11       90                                                No      No

Impression

Single IUP at 38w 0d
Follow up due to lagging growth (EFW at 18th %tile, AC at
6th %tile last week)
UA Doppler studies: high normal S/D ratio (90th %tile).  No
AEDF or REDF noted
BPP [DATE]
Normal amniotic fluid volume
Recommendations

Recommend delivery by 39 weeks

## 2021-04-18 ENCOUNTER — Other Ambulatory Visit: Payer: Self-pay

## 2021-04-18 ENCOUNTER — Emergency Department (HOSPITAL_BASED_OUTPATIENT_CLINIC_OR_DEPARTMENT_OTHER)
Admission: EM | Admit: 2021-04-18 | Discharge: 2021-04-18 | Disposition: A | Discharge status: Home / Self Care | Payer: Self-pay | Attending: Emergency Medicine | Admitting: Emergency Medicine

## 2021-04-18 ENCOUNTER — Encounter (HOSPITAL_BASED_OUTPATIENT_CLINIC_OR_DEPARTMENT_OTHER): Payer: Self-pay

## 2021-04-18 DIAGNOSIS — R29898 Other symptoms and signs involving the musculoskeletal system: Secondary | ICD-10-CM

## 2021-04-18 DIAGNOSIS — M79602 Pain in left arm: Secondary | ICD-10-CM | POA: Insufficient documentation

## 2021-04-18 DIAGNOSIS — J45909 Unspecified asthma, uncomplicated: Secondary | ICD-10-CM | POA: Insufficient documentation

## 2021-04-18 DIAGNOSIS — M6281 Muscle weakness (generalized): Secondary | ICD-10-CM | POA: Insufficient documentation

## 2021-04-18 LAB — TROPONIN I (HIGH SENSITIVITY): Troponin I (High Sensitivity): <2 ng/L (ref <18)

## 2021-04-18 LAB — BASIC METABOLIC PANEL
Anion gap: 8 (ref 5–15)
BUN: 13 mg/dL (ref 6–20)
CO2: 29 mmol/L (ref 22–32)
Calcium: 10.1 mg/dL (ref 8.9–10.3)
Chloride: 100 mmol/L (ref 98–111)
Creatinine, Ser: 0.93 mg/dL (ref 0.44–1.00)
GFR, Estimated: >60 mL/min (ref >60)
Glucose, Bld: 99 mg/dL (ref 70–99)
Potassium: 4.5 mmol/L (ref 3.5–5.1)
Sodium: 137 mmol/L (ref 135–145)

## 2021-04-18 LAB — CBC
HCT: 37.5 % (ref 36.0–46.0)
Hemoglobin: 12.5 g/dL (ref 12.0–15.0)
MCH: 26.8 pg (ref 26.0–34.0)
MCHC: 33.3 g/dL (ref 30.0–36.0)
MCV: 80.3 fL (ref 80.0–100.0)
Platelets: 259 10*3/uL (ref 150–400)
RBC: 4.67 MIL/uL (ref 3.87–5.11)
RDW: 13.1 % (ref 11.5–15.5)
WBC: 8.6 10*3/uL (ref 4.0–10.5)
nRBC: 0 % (ref 0.0–0.2)

## 2021-04-18 LAB — URINALYSIS, ROUTINE W REFLEX MICROSCOPIC
Bilirubin Urine: NEGATIVE
Glucose, UA: NEGATIVE mg/dL
Ketones, ur: NEGATIVE mg/dL
Nitrite: NEGATIVE
Protein, ur: NEGATIVE mg/dL
Specific Gravity, Urine: 1.012 (ref 1.005–1.030)
pH: 5.5 (ref 5.0–8.0)

## 2021-04-18 LAB — CBG MONITORING, ED: Glucose-Capillary: 86 mg/dL (ref 70–99)

## 2021-04-18 LAB — PREGNANCY, URINE: Preg Test, Ur: NEGATIVE

## 2021-04-18 NOTE — ED Notes (Signed)
EMT-P provided AVS using Teachback Method. Patient verbalizes understanding of Discharge Instructions. Opportunity for Questioning and Answers were provided by EMT-P. Patient Discharged from ED.  ? ?

## 2021-04-18 NOTE — ED Triage Notes (Signed)
Patient reports left leg pain/doesn't feel normal since last week. Report left arm started not feeling normal. yesterday. ? ?Pt is worried that she could be having a stroke or a heart attack and wants to be evaluated. States she does sleep on her left side. Tingling off/on on left foot.  ?

## 2021-04-18 NOTE — Discharge Instructions (Signed)
You were seen in the emergency department today for weakness/pain in your arm and leg. ? ?As we discussed your work-up looked reassuring today.  I have very low suspicion that this was related to your heart or stroke.  I want to keep track of how you are doing, and return to the emergency department for any new or worsening symptoms. ? ?It is also important to discuss your symptoms with a primary doctor.  You can call offices in the area and ask if they are taking any new patients. ?

## 2021-04-19 NOTE — ED Provider Notes (Signed)
?[facility]PT ?Provider Note ? ? ?CSN: [id number] ?Arrival date & time: 04/18/21  1643 ? ?  ? ?History ? ?No chief complaint on file. ? ? ?Anna Stout is a 31 y.o. female who presents the emergency department complaining of leg weakness x1 week, and left arm pain starting yesterday.  Patient is worried she could be having a stroke or heart attack and wants to be evaluated.  She states she has not been to a primary doctor in many years.  She reports she does sleep on her left side, and is not sure if this is contributed to her symptoms.  The weakness in her left leg she describes as a "tingling" and "warm sensation" that is off and on, especially in her left foot.  No fever, chills, chest pain, shortness of breath, vision changes, slurred speech, syncope.  No history of blood clots.  She also has concerns about diabetes today. ? ?HPI ? ?  ? ?Home Medications ?Prior to Admission medications   ?Medication Sig Start Date End Date Taking? Authorizing Provider  ?acetaminophen (TYLENOL) 325 MG tablet Take 2 tablets (650 mg total) by mouth every 4 (four) hours as needed (for pain scale < 4). 05/12/15   Caesar Chestnut, MD  ?ibuprofen (ADVIL,MOTRIN) 600 MG tablet Take 1 tablet (600 mg total) by mouth every 6 (six) hours. 05/12/15   Caesar Chestnut, MD  ?Prenatal Vit-Fe Fumarate-FA (MULTIVITAMIN-PRENATAL) 27-0.8 MG TABS tablet Take 1 tablet by mouth daily at 12 noon. 12/25/14   Lesly Dukes, MD  ?   ? ?Allergies    ?Banana   ? ?Review of Systems   ?Review of Systems  ?Constitutional:  Negative for chills and fever.  ?Respiratory:  Negative for shortness of breath.   ?Cardiovascular:  Negative for chest pain, palpitations and leg swelling.  ?Musculoskeletal:   ?     Left arm pain  ?Neurological:  Positive for weakness and numbness. Negative for dizziness, syncope, facial asymmetry, speech difficulty and headaches.  ?All other systems reviewed and are negative. ? ?Physical  Exam ?Updated Vital Signs ?BP 115/88   Pulse 70   Temp 98.2 ?F (36.8 ?C) (Oral)   Resp 18   Ht 5\' 11"  (1.803 m)   Wt 131.5 kg   SpO2 99%   BMI 40.45 kg/m?  ?Physical Exam ?Vitals and nursing note reviewed.  ?Constitutional:   ?   Appearance: Normal appearance.  ?HENT:  ?   Head: Normocephalic and atraumatic.  ?Eyes:  ?   Conjunctiva/sclera: Conjunctivae normal.  ?Cardiovascular:  ?   Rate and Rhythm: Normal rate and regular rhythm.  ?   Pulses:     ?     Posterior tibial pulses are 2+ on the right side and 2+ on the left side.  ?Pulmonary:  ?   Effort: Pulmonary effort is normal. No respiratory distress.  ?   Breath sounds: Normal breath sounds.  ?Abdominal:  ?   General: There is no distension.  ?   Palpations: Abdomen is soft.  ?   Tenderness: There is no abdominal tenderness.  ?Musculoskeletal:  ?   Right lower leg: No edema.  ?   Left lower leg: No edema.  ?   Comments: 5/5 strength in all extremities.  Sensation intact in all extremities.  Patient localizes pain to the underside of her left arm near her axilla.  No deformities palpated.  Unable to reproduce pain.  ?Skin: ?   General: Skin is warm and  dry.  ?Neurological:  ?   General: No focal deficit present.  ?   Mental Status: She is alert.  ?   Comments: Neuro: Speech is clear, able to follow commands. CN III-XII intact grossly intact. PERRLA. EOMI. Sensation intact throughout. Str 5/5 all extremities.  ? ? ?ED Results / Procedures / Treatments   ?Labs ?(all labs ordered are listed, but only abnormal results are displayed) ?Labs Reviewed  ?URINALYSIS, ROUTINE W REFLEX MICROSCOPIC - Abnormal; Notable for the following components:  ?    Result Value  ? Hgb urine dipstick TRACE (*)   ? Leukocytes,Ua TRACE (*)   ? All other components within normal limits  ?BASIC METABOLIC PANEL  ?CBC  ?PREGNANCY, URINE  ?CBG MONITORING, ED  ?TROPONIN I (HIGH SENSITIVITY)  ? ? ?EKG ?None ? ?Radiology ?No results found. ? ?Procedures ?Procedures  ? ? ?Medications Ordered  in ED ?Medications - No data to display ? ?ED Course/ Medical Decision Making/ A&P ?  ?                        ?Medical Decision Making ?Amount and/or Complexity of Data Reviewed ?Labs: ordered. ? ? ?This patient is a 31 year old female who presents to the ED for concern of weakness.  ? ?Differential diagnoses prior to evaluation: ?The emergent differential diagnosis includes, but is not limited to,  Neurologic causes (GBS, myasthenia gravis, CVA, MS, ALS, transverse myelitis, spinal cord injury, CVA, botulism); ACS, Arrhythmia, syncope, orthostatic hypotension, sepsis, hypoglycemia, electrolyte disturbance, hypothyroidism, respiratory failure, symptomatic anemia, dehydration, heat injury, polypharmacy, malignancy.  This is not an exhaustive differential.  ? ?Past Medical History / Co-morbidities: ?Asthma, gestational diabetes ? ?Physical Exam: ?Physical exam performed. The pertinent findings include: Patient is afebrile, not tachycardic, not hypoxic, and in no acute distress.   Denies chest pain or shortness of breath.  Normal neurologic exam as above.  Unable to reproduce pain of the left arm. ? ?Lab Tests/Imaging studies: ?I Ordered, and personally interpreted labs/imaging including CBC, BMP, troponin, CBG, urinalysis, urine pregnancy. The pertinent results include: No leukocytosis, normal hemoglobin.  Electrolytes in normal limits. CBG 86.  Troponin less than 2.  Urine pregnancy negative.  Urinalysis negative for infection.  I agree with the radiologist interpretation. ?  ?Cardiac monitoring: ?EKG was ordered and interpreted by my attending physician.  Interpretation of: Normal sinus rhythm.  I agree with this interpretation.  ? ?Disposition: ?After consideration of the diagnostic results and the patients response to treatment, I feel that patient is not requiring admission or inpatient treatment for her symptoms. I discussed the results with the patient, and I am very reassured by her overall benign work-up.   She is not having any chest pain, and her symptoms do not sound consistent with ACS or stroke, do not think she requires further work-up at this time.  I encouraged her to establish with a PCP.Marland Kitchen Discussed reasons to return to the emergency department, and the patient  is agreeable to the plan.  ? ?Final Clinical Impression(s) / ED Diagnoses ?Final diagnoses:  ?Left arm pain  ?Transient left leg weakness  ? ? ?Rx / DC Orders ?ED Discharge Orders   ? ? None  ? ?  ? ?Portions of this report may have been transcribed using voice recognition software. Every effort was made to ensure accuracy; however, inadvertent computerized transcription errors may be present. ? ?  ?Su Monks, PA-C ?04/19/21 1154 ? ?  ?Charlynne Pander, MD ?04/20/21  1453 ? ?
